# Patient Record
Sex: Male | Born: 1955
Health system: Southern US, Community
[De-identification: ages and names within clinical notes are randomized; demographics above are authoritative.]

## PROBLEM LIST (undated history)

## (undated) DIAGNOSIS — E237 Disorder of pituitary gland, unspecified: Secondary | ICD-10-CM

## (undated) DIAGNOSIS — I10 Essential (primary) hypertension: Secondary | ICD-10-CM

## (undated) HISTORY — PX: TONSILLECTOMY: SUR1361

## (undated) HISTORY — PX: DENTAL SURGERY: SHX609

---

## 1999-04-11 ENCOUNTER — Encounter: Admission: RE | Admit: 1999-04-11 | Discharge: 1999-05-29 | Payer: Self-pay | Admitting: Sports Medicine

## 1999-07-25 ENCOUNTER — Encounter: Payer: Self-pay | Admitting: Urology

## 1999-07-25 ENCOUNTER — Ambulatory Visit (HOSPITAL_COMMUNITY): Admission: RE | Admit: 1999-07-25 | Discharge: 1999-07-25 | Payer: Self-pay | Admitting: Urology

## 1999-08-20 ENCOUNTER — Ambulatory Visit (HOSPITAL_COMMUNITY): Admission: RE | Admit: 1999-08-20 | Discharge: 1999-08-20 | Payer: Self-pay | Admitting: Endocrinology

## 1999-08-20 ENCOUNTER — Encounter: Payer: Self-pay | Admitting: Endocrinology

## 1999-09-04 ENCOUNTER — Encounter: Payer: Self-pay | Admitting: Endocrinology

## 1999-09-04 ENCOUNTER — Encounter: Admission: RE | Admit: 1999-09-04 | Discharge: 1999-09-04 | Payer: Self-pay | Admitting: Endocrinology

## 2000-04-30 ENCOUNTER — Encounter: Admission: RE | Admit: 2000-04-30 | Discharge: 2000-07-02 | Payer: Self-pay | Admitting: Sports Medicine

## 2000-10-10 ENCOUNTER — Ambulatory Visit (HOSPITAL_COMMUNITY): Admission: RE | Admit: 2000-10-10 | Discharge: 2000-10-10 | Payer: Self-pay | Admitting: Endocrinology

## 2000-10-10 ENCOUNTER — Encounter: Payer: Self-pay | Admitting: Endocrinology

## 2001-06-23 ENCOUNTER — Encounter: Payer: Self-pay | Admitting: Neurology

## 2001-06-23 ENCOUNTER — Ambulatory Visit (HOSPITAL_COMMUNITY): Admission: RE | Admit: 2001-06-23 | Discharge: 2001-06-23 | Payer: Self-pay | Admitting: Neurology

## 2006-04-10 ENCOUNTER — Ambulatory Visit (HOSPITAL_COMMUNITY): Admission: RE | Admit: 2006-04-10 | Discharge: 2006-04-10 | Payer: Self-pay | Admitting: Gastroenterology

## 2013-08-23 ENCOUNTER — Emergency Department
Admission: EM | Admit: 2013-08-23 | Discharge: 2013-08-23 | Disposition: A | Discharge status: Home / Self Care | Payer: 59 | Source: Home / Self Care | Attending: Family Medicine | Admitting: Family Medicine

## 2013-08-23 ENCOUNTER — Encounter: Payer: Self-pay | Admitting: Emergency Medicine

## 2013-08-23 DIAGNOSIS — J069 Acute upper respiratory infection, unspecified: Secondary | ICD-10-CM

## 2013-08-23 DIAGNOSIS — J029 Acute pharyngitis, unspecified: Secondary | ICD-10-CM

## 2013-08-23 HISTORY — DX: Disorder of pituitary gland, unspecified: E23.7

## 2013-08-23 HISTORY — DX: Essential (primary) hypertension: I10

## 2013-08-23 LAB — POCT RAPID STREP A (OFFICE): Rapid Strep A Screen: NEGATIVE

## 2013-08-23 MED ORDER — AZITHROMYCIN 250 MG PO TABS: ORAL_TABLET | ORAL | Status: DC

## 2013-08-23 MED ORDER — BENZONATATE 200 MG PO CAPS: 200.0000 mg | ORAL_CAPSULE | Freq: Every day | ORAL | Status: DC

## 2013-08-23 NOTE — ED Notes (Signed)
Sore throat with possible low grade fever x 2 days. Taken Aleve. Rec'd flu vac this season.

## 2013-08-23 NOTE — Discharge Instructions (Signed)
Take plain Mucinex (1200 mg guaifenesin) twice daily for cough and congestion.  May add Sudafed for sinus congestion.   Increase fluid intake, rest. May use Afrin nasal spray (or generic oxymetazoline) twice daily for about 5 days.  Also recommend using saline nasal spray several times daily and saline nasal irrigation (AYR is a common brand) Try warm salt water gargles for sore throat.  Stop all antihistamines for now, and other non-prescription cough/cold preparations. May take Aleve, 2 tabs every 12 hours for sore throat, fever, etc. Begin Azithromycin if not improving about one week or if persistent fever develops  Follow-up with family doctor if not improving about10 days.    Salt Water Gargle This solution will help make your mouth and throat feel better. HOME CARE INSTRUCTIONS   Mix 1 teaspoon of salt in 8 ounces of warm water.  Gargle with this solution as much or often as you need or as directed. Swish and gargle gently if you have any sores or wounds in your mouth.  Do not swallow this mixture. Document Released: 04/11/2004 Document Revised: 09/30/2011 Document Reviewed: 09/02/2008 [facility] Patient Information 2014 [place].

## 2013-08-23 NOTE — ED Provider Notes (Signed)
CSN: [id number]     Arrival date & time 08/23/13  0912 History   First MD Initiated Contact with Patient 08/23/13 [phone]     Chief Complaint  Patient presents with  . Sore Throat      HPI Comments: Patient was on a cruise ship during the past week.  Three days ago he developed a mild sore throat, followed by sinus congestion and post-nasal drainage.  He has an occasional cough.  He has had fatigue and a low grade fever.  No myalgias.  The history is provided by the patient.    Past Medical History  Diagnosis Date  . Hypertension   . Pituitary gland disorder    Past Surgical History  Procedure Laterality Date  . Tonsillectomy    . Dental surgery     Family History  Problem Relation Age of Onset  . Congestive Heart Failure Father    History  Substance Use Topics  . Smoking status: Never Smoker   . Smokeless tobacco: Never Used  . Alcohol Use: Yes    Review of Systems + sore throat + occasional cough No pleuritic pain No wheezing + nasal congestion + post-nasal drainage No sinus pain/pressure No itchy/red eyes No earache No hemoptysis No SOB + low grade fever, + chills No nausea No vomiting No abdominal pain No diarrhea No urinary symptoms No skin rash + fatigue No myalgias No headache Used OTC meds without relief  Allergies  Review of patient's allergies indicates no known allergies.  Home Medications   Current Outpatient Rx  Name  Route  Sig  Dispense  Refill  . Ezetimibe-Simvastatin (VYTORIN PO)   Oral   Take by mouth.         . Levothyroxine Sodium (SYNTHROID PO)   Oral   Take by mouth.         . Testosterone (ANDROGEL) 20.25 MG/1.25GM (1.62%) GEL   Transdermal   Place onto the skin.         . TraZODone & Diet Manage Prod (TRAZAMINE PO)   Oral   Take by mouth.         Marland Kitchen azithromycin (ZITHROMAX Z-PAK) 250 MG tablet      Take 2 tabs today; then begin one tab once daily for 4 more days. (Rx void after 08/31/13)   6 each   0   .  benzonatate (TESSALON) 200 MG capsule   Oral   Take 1 capsule (200 mg total) by mouth at bedtime. Take as needed for cough   12 capsule   0    BP 155/90  Pulse 67  Temp(Src) 98.6 F (37 C) (Oral)  Resp 12  Ht 5\' 9"  (1.753 m)  Wt 179 lb (81.194 kg)  BMI 26.42 kg/m2  SpO2 98% Physical Exam Nursing notes and Vital Signs reviewed. Appearance:  Patient appears healthy, stated age, and in no acute distress Eyes:  Pupils are equal, round, and reactive to light and accomodation.  Extraocular movement is intact.  Conjunctivae are not inflamed  Ears:  Canals normal.  Tympanic membranes normal.  Nose:  Mildly congested turbinates.  No sinus tenderness.   Pharynx:   Minimal erythema Neck:  Supple.   Nontender shotty posterior nodes are palpated bilaterally  Lungs:  Clear to auscultation.  Breath sounds are equal.  Heart:  Regular rate and rhythm without murmurs, rubs, or gallops.  Abdomen:  Nontender without masses or hepatosplenomegaly.  Bowel sounds are present.  No CVA or flank tenderness.  Extremities:  No edema.  No calf tenderness Skin:  No rash present.   ED Course  Procedures  none    Labs Reviewed  STREP A DNA PROBE  POCT RAPID STREP A (OFFICE) negative         MDM   1. Acute pharyngitis   2. Acute upper respiratory infections of unspecified site; suspect early viral URI    Throat culture pending There is no evidence of bacterial infection today.  Treat symptomatically for now: Prescription written for Benzonatate [facility]) to take at bedtime for night-time cough.  Take plain Mucinex (1200 mg guaifenesin) twice daily for cough and congestion.  May add Sudafed for sinus congestion.   Increase fluid intake, rest. May use Afrin nasal spray (or generic oxymetazoline) twice daily for about 5 days.  Also recommend using saline nasal spray several times daily and saline nasal irrigation (AYR is a common brand) Try warm salt water gargles for sore throat.  Stop all  antihistamines for now, and other non-prescription cough/cold preparations. May take Aleve, 2 tabs every 12 hours for sore throat, fever, etc. Begin Azithromycin if not improving about one week or if persistent fever develops (Given a prescription to hold, with an expiration date)  Follow-up with family doctor if not improving about10 days.     Kandra Nicolas, MD 08/23/13 1031

## 2013-08-24 LAB — STREP A DNA PROBE: GASP: NEGATIVE

## 2013-08-25 ENCOUNTER — Telehealth: Payer: Self-pay | Admitting: *Deleted

## 2015-07-26 MED FILL — TESTOSTERONE 50 MG/5 GRAM P: 50 MG/5GM | 30 days supply | Qty: 150 | Fill #1

## 2015-08-01 MED FILL — SYNTHROID 88 MCG TABLET: 88 | 90 days supply | Qty: 105 | Fill #2

## 2015-08-23 MED FILL — TESTOSTERONE 50 MG/5 GRAM P: 50 MG/5GM | 30 days supply | Qty: 150 | Fill #2

## 2015-09-04 MED FILL — VYTORIN 10-40 MG TABLET: 10-40 | 90 days supply | Qty: 90 | Fill #1

## 2015-09-20 MED FILL — TESTOSTERONE 50 MG/5 GRAM P: 50 MG/5GM | 30 days supply | Qty: 150 | Fill #3

## 2015-09-25 MED FILL — traZODone HCL 50 MG TABS: 50 | 90 days supply | Qty: 90 | Fill #2

## 2015-09-28 DIAGNOSIS — E784 Other hyperlipidemia: Secondary | ICD-10-CM | POA: Diagnosis not present

## 2015-09-28 DIAGNOSIS — E038 Other specified hypothyroidism: Secondary | ICD-10-CM | POA: Diagnosis not present

## 2015-09-28 DIAGNOSIS — E236 Other disorders of pituitary gland: Secondary | ICD-10-CM | POA: Diagnosis not present

## 2015-09-28 DIAGNOSIS — E291 Testicular hypofunction: Secondary | ICD-10-CM | POA: Diagnosis not present

## 2015-10-02 DIAGNOSIS — E291 Testicular hypofunction: Secondary | ICD-10-CM | POA: Diagnosis not present

## 2015-10-02 DIAGNOSIS — D751 Secondary polycythemia: Secondary | ICD-10-CM | POA: Diagnosis not present

## 2015-10-03 DIAGNOSIS — E291 Testicular hypofunction: Secondary | ICD-10-CM | POA: Diagnosis not present

## 2015-10-03 DIAGNOSIS — E038 Other specified hypothyroidism: Secondary | ICD-10-CM | POA: Diagnosis not present

## 2015-10-03 DIAGNOSIS — Z6827 Body mass index (BMI) 27.0-27.9, adult: Secondary | ICD-10-CM | POA: Diagnosis not present

## 2015-10-03 DIAGNOSIS — Z1389 Encounter for screening for other disorder: Secondary | ICD-10-CM | POA: Diagnosis not present

## 2015-10-03 DIAGNOSIS — R7301 Impaired fasting glucose: Secondary | ICD-10-CM | POA: Diagnosis not present

## 2015-10-03 DIAGNOSIS — E236 Other disorders of pituitary gland: Secondary | ICD-10-CM | POA: Diagnosis not present

## 2015-10-03 DIAGNOSIS — E784 Other hyperlipidemia: Secondary | ICD-10-CM | POA: Diagnosis not present

## 2015-10-03 DIAGNOSIS — K635 Polyp of colon: Secondary | ICD-10-CM | POA: Diagnosis not present

## 2015-10-03 DIAGNOSIS — D751 Secondary polycythemia: Secondary | ICD-10-CM | POA: Diagnosis not present

## 2015-10-23 MED FILL — TESTOSTERONE 50 MG/5 GRAM P: 50 MG/5GM | 30 days supply | Qty: 150 | Fill #4

## 2015-11-06 MED FILL — SYNTHROID 88 MCG TABLET: 88 | 90 days supply | Qty: 105 | Fill #3

## 2015-11-20 MED FILL — TESTOSTERONE 50 MG/5 GRAM P: 50 MG/5GM | 30 days supply | Qty: 150 | Fill #5

## 2015-12-04 MED FILL — EZETIMIBE-SIMVAS 10-40 MG T: 10-40 | 90 days supply | Qty: 90 | Fill #2

## 2015-12-25 MED FILL — TESTOSTERONE 50 MG/5 GRAM P: 50 MG/5GM | 30 days supply | Qty: 150 | Fill #0

## 2015-12-25 MED FILL — traZODone HCL 50 MG TABS: 50 | 90 days supply | Qty: 90 | Fill #3

## 2016-02-05 MED FILL — TESTOSTERONE 50 MG/5 GRAM P: 50 MG/5GM | 30 days supply | Qty: 150 | Fill #1

## 2016-02-06 MED FILL — SYNTHROID 88 MCG TABLET: 88 | 90 days supply | Qty: 105 | Fill #4

## 2016-02-26 MED FILL — EZETIMIBE-SIMVASTATIN 10-40: 10-40 | 90 days supply | Qty: 90 | Fill #0

## 2016-02-28 MED FILL — TESTOSTERONE 50 MG/5 GRAM G: 50 MG/5GM | 30 days supply | Qty: 150 | Fill #0

## 2016-03-26 MED FILL — traZODone HCL 50 MG TABS: 50 | 90 days supply | Qty: 90 | Fill #0

## 2016-03-27 MED FILL — TESTOSTERONE 50 MG/5 GRAM G: 50 MG/5GM | 30 days supply | Qty: 150 | Fill #0

## 2016-04-16 DIAGNOSIS — H52221 Regular astigmatism, right eye: Secondary | ICD-10-CM | POA: Diagnosis not present

## 2016-04-16 DIAGNOSIS — H524 Presbyopia: Secondary | ICD-10-CM | POA: Diagnosis not present

## 2016-04-16 DIAGNOSIS — H5203 Hypermetropia, bilateral: Secondary | ICD-10-CM | POA: Diagnosis not present

## 2016-04-16 MED FILL — TESTOSTERONE 50 MG/5 GRAM P: 50 MG/5GM | 30 days supply | Qty: 150 | Fill #0

## 2016-04-29 MED FILL — SYNTHROID 88 MCG TABLET: 88 | 90 days supply | Qty: 105 | Fill #0

## 2016-05-07 DIAGNOSIS — Z1211 Encounter for screening for malignant neoplasm of colon: Secondary | ICD-10-CM | POA: Diagnosis not present

## 2016-05-09 DIAGNOSIS — E298 Other testicular dysfunction: Secondary | ICD-10-CM | POA: Diagnosis not present

## 2016-05-09 DIAGNOSIS — E236 Other disorders of pituitary gland: Secondary | ICD-10-CM | POA: Diagnosis not present

## 2016-05-09 DIAGNOSIS — E038 Other specified hypothyroidism: Secondary | ICD-10-CM | POA: Diagnosis not present

## 2016-05-09 DIAGNOSIS — E784 Other hyperlipidemia: Secondary | ICD-10-CM | POA: Diagnosis not present

## 2016-05-09 DIAGNOSIS — R7301 Impaired fasting glucose: Secondary | ICD-10-CM | POA: Diagnosis not present

## 2016-05-13 DIAGNOSIS — D751 Secondary polycythemia: Secondary | ICD-10-CM | POA: Diagnosis not present

## 2016-05-13 DIAGNOSIS — K635 Polyp of colon: Secondary | ICD-10-CM | POA: Diagnosis not present

## 2016-05-13 DIAGNOSIS — E038 Other specified hypothyroidism: Secondary | ICD-10-CM | POA: Diagnosis not present

## 2016-05-13 DIAGNOSIS — E236 Other disorders of pituitary gland: Secondary | ICD-10-CM | POA: Diagnosis not present

## 2016-05-13 DIAGNOSIS — R7301 Impaired fasting glucose: Secondary | ICD-10-CM | POA: Diagnosis not present

## 2016-05-13 DIAGNOSIS — E784 Other hyperlipidemia: Secondary | ICD-10-CM | POA: Diagnosis not present

## 2016-05-13 DIAGNOSIS — E298 Other testicular dysfunction: Secondary | ICD-10-CM | POA: Diagnosis not present

## 2016-05-13 DIAGNOSIS — Z6827 Body mass index (BMI) 27.0-27.9, adult: Secondary | ICD-10-CM | POA: Diagnosis not present

## 2016-05-13 MED FILL — SIMVASTATIN 40 MG TABLET: 40 | 90 days supply | Qty: 90 | Fill #0

## 2016-05-13 MED FILL — EZETIMIBE 10 MG TABLET: 10 | 90 days supply | Qty: 90 | Fill #0

## 2016-05-13 MED FILL — TESTOSTERONE 50 MG/5 GRAM P: 50 MG/5GM | 30 days supply | Qty: 150 | Fill #0

## 2016-05-27 MED FILL — GAVILYTE-G SOLUTION: 236 | 1 days supply | Qty: 4000 | Fill #0

## 2016-05-29 DIAGNOSIS — Z1211 Encounter for screening for malignant neoplasm of colon: Secondary | ICD-10-CM | POA: Diagnosis not present

## 2016-06-05 MED FILL — TESTOSTERONE 50 MG/5 GRAM P: 50 MG/5GM | 30 days supply | Qty: 150 | Fill #1

## 2016-06-26 MED FILL — traZODone HCL 50 MG TABS: 50 | 90 days supply | Qty: 90 | Fill #1

## 2016-07-18 MED FILL — TESTOSTERONE 50 MG/5 GRAM P: 50 MG/5GM | 30 days supply | Qty: 150 | Fill #2

## 2016-08-19 MED FILL — LEVOTHYROXINE 88 MCG TABLET: 88 | 90 days supply | Qty: 105 | Fill #0

## 2016-08-19 MED FILL — TESTOSTERONE 50 MG/5 GRAM P: 50 MG/5GM | 30 days supply | Qty: 150 | Fill #3

## 2016-09-09 MED FILL — SHINGRIX VIAL KIT: 50 | 28 days supply | Qty: 2 | Fill #0

## 2016-09-09 MED FILL — SIMVASTATIN 40 MG TABLET: 40 | 90 days supply | Qty: 90 | Fill #1

## 2016-09-09 MED FILL — EZETIMIBE 10 MG TABLET: 10 | 90 days supply | Qty: 90 | Fill #1

## 2016-09-17 MED FILL — TESTOSTERONE 50 MG/5 GRAM P: 50 MG/5GM | 30 days supply | Qty: 150 | Fill #4

## 2016-09-23 MED FILL — traZODone HCL 50 MG TABS: 50 | 90 days supply | Qty: 90 | Fill #2

## 2016-10-15 MED FILL — TESTOSTERONE 50 MG/5 GRAM P: 50 MG/5GM | 30 days supply | Qty: 150 | Fill #0

## 2016-11-14 MED FILL — LEVOTHYROXINE 88 MCG TABLET: 88 | 90 days supply | Qty: 105 | Fill #1

## 2016-11-18 MED FILL — TESTOSTERONE 50 MG/5 GRAM P: 50 MG/5GM | 30 days supply | Qty: 150 | Fill #1

## 2016-11-21 DIAGNOSIS — E784 Other hyperlipidemia: Secondary | ICD-10-CM | POA: Diagnosis not present

## 2016-11-21 DIAGNOSIS — Z125 Encounter for screening for malignant neoplasm of prostate: Secondary | ICD-10-CM | POA: Diagnosis not present

## 2016-11-21 DIAGNOSIS — E039 Hypothyroidism, unspecified: Secondary | ICD-10-CM | POA: Diagnosis not present

## 2016-11-21 DIAGNOSIS — E236 Other disorders of pituitary gland: Secondary | ICD-10-CM | POA: Diagnosis not present

## 2016-11-21 DIAGNOSIS — E291 Testicular hypofunction: Secondary | ICD-10-CM | POA: Diagnosis not present

## 2016-11-21 DIAGNOSIS — R7301 Impaired fasting glucose: Secondary | ICD-10-CM | POA: Diagnosis not present

## 2016-11-26 DIAGNOSIS — E038 Other specified hypothyroidism: Secondary | ICD-10-CM | POA: Diagnosis not present

## 2016-11-26 DIAGNOSIS — E291 Testicular hypofunction: Secondary | ICD-10-CM | POA: Diagnosis not present

## 2016-11-26 DIAGNOSIS — Z6826 Body mass index (BMI) 26.0-26.9, adult: Secondary | ICD-10-CM | POA: Diagnosis not present

## 2016-11-26 DIAGNOSIS — E236 Other disorders of pituitary gland: Secondary | ICD-10-CM | POA: Diagnosis not present

## 2016-11-26 DIAGNOSIS — R7301 Impaired fasting glucose: Secondary | ICD-10-CM | POA: Diagnosis not present

## 2016-11-26 DIAGNOSIS — E784 Other hyperlipidemia: Secondary | ICD-10-CM | POA: Diagnosis not present

## 2016-11-26 DIAGNOSIS — D751 Secondary polycythemia: Secondary | ICD-10-CM | POA: Diagnosis not present

## 2016-11-26 DIAGNOSIS — K635 Polyp of colon: Secondary | ICD-10-CM | POA: Diagnosis not present

## 2016-12-02 MED FILL — SIMVASTATIN 40 MG TABLET: 40 | 90 days supply | Qty: 90 | Fill #0

## 2016-12-02 MED FILL — EZETIMIBE 10 MG TABLET: 10 | 90 days supply | Qty: 90 | Fill #0

## 2016-12-18 MED FILL — TESTOSTERONE 50 MG/5 GRAM P: 50 MG/5GM | 30 days supply | Qty: 150 | Fill #2

## 2016-12-19 MED FILL — traZODone HCL 50 MG TABS: 50 | 90 days supply | Qty: 90 | Fill #3

## 2017-01-14 MED FILL — TESTOSTERONE 50 MG/5 GRAM P: 50 MG/5GM | 30 days supply | Qty: 150 | Fill #3

## 2017-02-24 MED FILL — TESTOSTERONE 50 MG/5 GRAM P: 50 MG/5GM | 30 days supply | Qty: 150 | Fill #4

## 2017-02-26 MED FILL — SIMVASTATIN 40 MG TABLET: 40 | 90 days supply | Qty: 90 | Fill #1

## 2017-02-26 MED FILL — LEVOTHYROXINE 88 MCG TABLET: 88 | 90 days supply | Qty: 105 | Fill #2

## 2017-02-26 MED FILL — EZETIMIBE 10 MG TABLET: 10 | 90 days supply | Qty: 90 | Fill #1

## 2017-03-25 MED FILL — traZODone HCL 50 MG TABS: 50 | 90 days supply | Qty: 90 | Fill #0

## 2017-03-25 MED FILL — TESTOSTERONE 50 MG/5 GRAM P: 50 MG/5GM | 30 days supply | Qty: 150 | Fill #0

## 2017-04-22 MED FILL — TESTOSTERONE 50 MG/5 GRAM P: 50 MG/5GM | 30 days supply | Qty: 150 | Fill #1

## 2017-04-23 DIAGNOSIS — H5203 Hypermetropia, bilateral: Secondary | ICD-10-CM | POA: Diagnosis not present

## 2017-05-19 MED FILL — TESTOSTERONE 50 MG/5 GRAM P: 50 MG/5GM | 30 days supply | Qty: 150 | Fill #2

## 2017-05-28 DIAGNOSIS — E038 Other specified hypothyroidism: Secondary | ICD-10-CM | POA: Diagnosis not present

## 2017-05-28 DIAGNOSIS — E7849 Other hyperlipidemia: Secondary | ICD-10-CM | POA: Diagnosis not present

## 2017-05-28 DIAGNOSIS — R7301 Impaired fasting glucose: Secondary | ICD-10-CM | POA: Diagnosis not present

## 2017-05-28 DIAGNOSIS — E291 Testicular hypofunction: Secondary | ICD-10-CM | POA: Diagnosis not present

## 2017-06-02 DIAGNOSIS — E7849 Other hyperlipidemia: Secondary | ICD-10-CM | POA: Diagnosis not present

## 2017-06-02 DIAGNOSIS — K635 Polyp of colon: Secondary | ICD-10-CM | POA: Diagnosis not present

## 2017-06-02 DIAGNOSIS — G4700 Insomnia, unspecified: Secondary | ICD-10-CM | POA: Diagnosis not present

## 2017-06-02 DIAGNOSIS — Z0000 Encounter for general adult medical examination without abnormal findings: Secondary | ICD-10-CM | POA: Diagnosis not present

## 2017-06-02 DIAGNOSIS — E038 Other specified hypothyroidism: Secondary | ICD-10-CM | POA: Diagnosis not present

## 2017-06-02 DIAGNOSIS — E236 Other disorders of pituitary gland: Secondary | ICD-10-CM | POA: Diagnosis not present

## 2017-06-02 DIAGNOSIS — E291 Testicular hypofunction: Secondary | ICD-10-CM | POA: Diagnosis not present

## 2017-06-02 DIAGNOSIS — R7301 Impaired fasting glucose: Secondary | ICD-10-CM | POA: Diagnosis not present

## 2017-06-02 DIAGNOSIS — D751 Secondary polycythemia: Secondary | ICD-10-CM | POA: Diagnosis not present

## 2017-06-02 MED FILL — LEVOTHYROXINE 88 MCG TABLET: 88 | 90 days supply | Qty: 105 | Fill #0

## 2017-06-02 MED FILL — EZETIMIBE 10 MG TABS: 10 | 90 days supply | Qty: 90 | Fill #0

## 2017-06-02 MED FILL — SIMVASTATIN 40 MG TABLET: 40 | 90 days supply | Qty: 90 | Fill #0

## 2017-06-16 MED FILL — TESTOSTERONE 50 MG/5 GRAM P: 50 MG/5GM | 30 days supply | Qty: 150 | Fill #3

## 2017-06-24 MED FILL — traZODone HCL 50 MG TABS: 50 | 90 days supply | Qty: 90 | Fill #1

## 2017-07-16 MED FILL — TESTOSTERONE 50 MG/5 GRAM P: 50 MG/5GM | 30 days supply | Qty: 150 | Fill #4

## 2017-08-13 MED FILL — TESTOSTERONE 50 MG/5 GRAM P: 50 MG/5GM | 30 days supply | Qty: 150 | Fill #5

## 2017-08-26 MED FILL — SIMVASTATIN 40 MG TABLET: 40 | 90 days supply | Qty: 90 | Fill #1

## 2017-08-26 MED FILL — EZETIMIBE 10 MG TABS: 10 | 90 days supply | Qty: 90 | Fill #1

## 2017-08-26 MED FILL — LEVOTHYROXINE 88 MCG TABLET: 88 | 90 days supply | Qty: 105 | Fill #1

## 2017-09-22 MED FILL — traZODone HCL 50 MG TABS: 50 | 90 days supply | Qty: 90 | Fill #2

## 2017-10-02 MED FILL — TESTOSTERONE 50 MG/5 GRAM P: 50 MG/5GM | 30 days supply | Qty: 150 | Fill #0

## 2017-10-28 MED FILL — TESTOSTERONE 50 MG/5 GRAM P: 50 MG/5GM | 30 days supply | Qty: 150 | Fill #1

## 2017-11-19 MED FILL — EZETIMIBE 10 MG TABS: 10 | 90 days supply | Qty: 90 | Fill #0

## 2017-11-19 MED FILL — SIMVASTATIN 40 MG TABLET: 40 | 90 days supply | Qty: 90 | Fill #0

## 2017-11-19 MED FILL — LEVOTHYROXINE 88 MCG TABLET: 88 | 90 days supply | Qty: 105 | Fill #2

## 2017-12-01 MED FILL — TESTOSTERONE 50 MG/5 GRAM P: 50 MG/5GM | 30 days supply | Qty: 150 | Fill #2

## 2017-12-04 DIAGNOSIS — E038 Other specified hypothyroidism: Secondary | ICD-10-CM | POA: Diagnosis not present

## 2017-12-04 DIAGNOSIS — E7849 Other hyperlipidemia: Secondary | ICD-10-CM | POA: Diagnosis not present

## 2017-12-04 DIAGNOSIS — Z0000 Encounter for general adult medical examination without abnormal findings: Secondary | ICD-10-CM | POA: Diagnosis not present

## 2017-12-04 DIAGNOSIS — E291 Testicular hypofunction: Secondary | ICD-10-CM | POA: Diagnosis not present

## 2017-12-04 DIAGNOSIS — R7301 Impaired fasting glucose: Secondary | ICD-10-CM | POA: Diagnosis not present

## 2017-12-09 DIAGNOSIS — R7301 Impaired fasting glucose: Secondary | ICD-10-CM | POA: Diagnosis not present

## 2017-12-09 DIAGNOSIS — K635 Polyp of colon: Secondary | ICD-10-CM | POA: Diagnosis not present

## 2017-12-09 DIAGNOSIS — E7849 Other hyperlipidemia: Secondary | ICD-10-CM | POA: Diagnosis not present

## 2017-12-09 DIAGNOSIS — Z6827 Body mass index (BMI) 27.0-27.9, adult: Secondary | ICD-10-CM | POA: Diagnosis not present

## 2017-12-09 DIAGNOSIS — E038 Other specified hypothyroidism: Secondary | ICD-10-CM | POA: Diagnosis not present

## 2017-12-09 DIAGNOSIS — E236 Other disorders of pituitary gland: Secondary | ICD-10-CM | POA: Diagnosis not present

## 2017-12-09 DIAGNOSIS — E291 Testicular hypofunction: Secondary | ICD-10-CM | POA: Diagnosis not present

## 2017-12-09 DIAGNOSIS — D751 Secondary polycythemia: Secondary | ICD-10-CM | POA: Diagnosis not present

## 2017-12-18 MED FILL — traZODone HCL 50 MG TABS: 50 | 90 days supply | Qty: 90 | Fill #3

## 2018-01-04 MED FILL — TESTOSTERONE 50 MG/5 GRAM P: 50 MG/5GM | 30 days supply | Qty: 150 | Fill #3

## 2018-02-09 MED FILL — TESTOSTERONE 50 MG/5 GRAM P: 50 MG/5GM | 30 days supply | Qty: 150 | Fill #4

## 2018-02-26 MED FILL — LEVOTHYROXINE 88 MCG TABLET: 88 | 84 days supply | Qty: 96 | Fill #0

## 2018-02-26 MED FILL — EZETIMIBE 10 MG TABLET: 10 | 90 days supply | Qty: 90 | Fill #1

## 2018-02-26 MED FILL — SIMVASTATIN 40 MG TABS: 40 | 90 days supply | Qty: 90 | Fill #1

## 2018-02-27 MED FILL — traZODone HCL 50 MG TABS: 50 | 90 days supply | Qty: 90 | Fill #0

## 2018-03-04 DIAGNOSIS — L239 Allergic contact dermatitis, unspecified cause: Secondary | ICD-10-CM | POA: Diagnosis not present

## 2018-03-04 DIAGNOSIS — Z6827 Body mass index (BMI) 27.0-27.9, adult: Secondary | ICD-10-CM | POA: Diagnosis not present

## 2018-03-04 MED FILL — DOXYCYCLINE HYCLATE 100 MG: 100 | 7 days supply | Qty: 14 | Fill #0

## 2018-03-05 MED FILL — METHYLPREDNISOLONE 4 MG TAB: 4 | 6 days supply | Qty: 21 | Fill #0

## 2018-03-10 MED FILL — TESTOSTERONE 50 MG/5 GRAM P: 50 MG/5GM | 30 days supply | Qty: 150 | Fill #5

## 2018-04-04 DIAGNOSIS — Z23 Encounter for immunization: Secondary | ICD-10-CM | POA: Diagnosis not present

## 2018-04-14 MED FILL — TESTOSTERONE 50 MG/5 GRAM P: 50 MG/5GM | 30 days supply | Qty: 150 | Fill #0

## 2018-04-16 DIAGNOSIS — E785 Hyperlipidemia, unspecified: Secondary | ICD-10-CM | POA: Insufficient documentation

## 2018-04-16 NOTE — Progress Notes (Signed)
Cardiology Office Note:    Date:  04/17/2018   ID:  Kirk [name], DOB December 30, 1955, MRN [id number]  PCP:  [name], MD  Cardiologist:  No primary care provider on file.   Referring MD: [name], MD   Chief Complaint  Patient presents with  . Advice Only    Primary prevention    History of Present Illness:    Kirk [name] is a 62 y.o. male with a family history of CAD who is self-referred to consider primary prevention.  Prior history of hypertension initially on medical therapy but currently therapy discontinued by Dr. Forde Dandy, because of normalization.  The patient is very active.  He has no complaints with physical activity.  Denies orthopnea, PND, lower extremity swelling, chest discomfort, orthopnea, PND, and palpitations.  He has never had a cardiac diagnosis.  He does not smoke.  He is not diabetic.  He does not drink alcohol on a regular basis.  His father had CHF and died of a myocardial infarction at age 54, the patient's current age.  He exercises regularly.  He has been proactive concerning lipid management via Dr. Forde Dandy.  Most recent LDL was 69.  He has been on statin therapy for years.   Past Medical History:  Diagnosis Date  . Hypertension   . Pituitary gland disorder [facility])     Past Surgical History:  Procedure Laterality Date  . DENTAL SURGERY    . TONSILLECTOMY      Current Medications: Current Meds  Medication Sig  . aspirin EC 81 MG tablet Take 81 mg by mouth daily.  Marland Kitchen ezetimibe (ZETIA) 10 MG tablet Take 10 mg by mouth daily.  Marland Kitchen levothyroxine (SYNTHROID, LEVOTHROID) 88 MCG tablet Take 88 mcg by mouth daily before breakfast.  . Melatonin 5 MG TABS Take 5 mg by mouth at bedtime.  . Multiple Vitamins-Minerals (CENTRUM SILVER PO) Take half (1/2) tablet by mouth every other day.  . Naproxen Sodium 220 MG CAPS Take 220 mg by mouth 2 (two) times daily as needed (pain).  . simvastatin (ZOCOR) 40 MG tablet Take 40 mg by mouth daily.  Marland Kitchen testosterone  (ANDROGEL) 50 MG/5GM (1%) GEL Apply 5 grams onto the skin daily.  . traZODone (DESYREL) 50 MG tablet Take 50 mg by mouth at bedtime.     Allergies:   Patient has no known allergies.   Social History   Socioeconomic History  . Marital status: Married    Spouse name: Not on file  . Number of children: Not on file  . Years of education: Not on file  . Highest education level: Not on file  Occupational History  . Not on file  Social Needs  . Financial resource strain: Not on file  . Food insecurity:    Worry: Not on file    Inability: Not on file  . Transportation needs:    Medical: Not on file    Non-medical: Not on file  Tobacco Use  . Smoking status: Never Smoker  . Smokeless tobacco: Never Used  Substance and Sexual Activity  . Alcohol use: Yes  . Drug use: No  . Sexual activity: Not on file  Lifestyle  . Physical activity:    Days per week: Not on file    Minutes per session: Not on file  . Stress: Not on file  Relationships  . Social connections:    Talks on phone: Not on file    Gets together: Not on file  Attends religious service: Not on file    Active member of club or organization: Not on file    Attends meetings of clubs or organizations: Not on file    Relationship status: Not on file  Other Topics Concern  . Not on file  Social History Narrative  . Not on file     Family History: The patient's family history includes Congestive Heart Failure in his father; Heart disease in his father; Parkinson's disease in his mother; Stroke in his mother.  ROS:   Please see the history of present illness.    Occasional skipped heartbeat and irregularity in heartbeat.  Rare snoring according to the patient's wife.  All other systems reviewed and are negative.  EKGs/Labs/Other Studies Reviewed:    The following studies were reviewed today:  LDL 69 on Dec 04, 2017  Hemoglobin A1c 5.4 on same date  Creatinine 1.0  EKG:  EKG is  ordered today.  The ekg  ordered today demonstrates sinus rhythm, right bundle, left anterior hemiblock, PR interval is normal at 162 ms.  Recent Labs: No results found for requested labs within last 8760 hours.  Recent Lipid Panel No results found for: CHOL, TRIG, HDL, CHOLHDL, VLDL, LDLCALC, LDLDIRECT  Physical Exam:    VS:  BP (!) 136/94   Pulse 63   Ht 5' 9.5" (1.765 m)   Wt 182 lb 6.4 oz (82.7 kg)   BMI 26.55 kg/m     Wt Readings from Last 3 Encounters:  04/17/18 182 lb 6.4 oz (82.7 kg)  08/23/13 179 lb (81.2 kg)     GEN:  Well nourished, well developed in no acute distress HEENT: Normal NECK: No JVD. LYMPHATICS: No lymphadenopathy CARDIAC: RRR, no murmur, S4 gallop, no edema. VASCULAR: 2+ bilateral radial pulses.  No bruits. RESPIRATORY:  Clear to auscultation without rales, wheezing or rhonchi  ABDOMEN: Soft, non-tender, non-distended, No pulsatile mass, MUSCULOSKELETAL: No deformity  SKIN: Warm and dry NEUROLOGIC:  Alert and oriented x 3 PSYCHIATRIC:  Normal affect   ASSESSMENT:    1. Abnormal EKG   2. Other hyperlipidemia   3. Essential hypertension    PLAN:    In order of problems listed above:  1. 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  2D Doppler echocardiogram to rule out myocardial process and to assess LV function. 2. Already on statin therapy and agree with management 3. Measure blood pressures at home.  Exclude the possibility that the ones treated hypertension has recurred and needs therapy.  Low-salt diet discussed.  We will perform an exercise treadmill test to assess blood pressure with physical activity since he is so active.  If LVH is present on echocardiography, this will suggest that blood pressure therapy is again needed.  Target should be 130/80 mmHg or less.  Further follow-up and management will be dependent upon findings.   Medication Adjustments/Labs and Tests Ordered: Current medicines are reviewed at  length with the patient today.  Concerns regarding medicines are outlined above.  Orders Placed This Encounter  Procedures  . Exercise Tolerance Test  . EKG 12-Lead  . ECHOCARDIOGRAM COMPLETE   No orders of the defined types were placed in this encounter.   Patient Instructions  Medication Instructions:  Your physician recommends that you continue on your current medications as directed. Please refer to the Current Medication list given to you today.  Labwork: None  Testing/Procedures: Your physician has requested that you have an exercise tolerance test. For further  information please visit [email or web address]. Please also follow instruction sheet, as given.  Your physician has requested that you have an echocardiogram. Echocardiography is a painless test that uses sound waves to create images of your heart. It provides your doctor with information about the size and shape of your heart and how well your heart's chambers and valves are working. This procedure takes approximately one hour. There are no restrictions for this procedure.   Follow-Up: Your physician recommends that you schedule a follow-up appointment as needed with Dr. Tamala Julian.    Any Other Special Instructions Will Be Listed Below (If Applicable).     If you need a refill on your cardiac medications before your next appointment, please call your pharmacy.      Signed, Sinclair Grooms, MD  04/17/2018 5:19 PM    [place]

## 2018-04-17 ENCOUNTER — Encounter: Payer: Self-pay | Admitting: Interventional Cardiology

## 2018-04-17 ENCOUNTER — Encounter (INDEPENDENT_AMBULATORY_CARE_PROVIDER_SITE_OTHER): Payer: Self-pay

## 2018-04-17 ENCOUNTER — Ambulatory Visit (INDEPENDENT_AMBULATORY_CARE_PROVIDER_SITE_OTHER): Payer: 59 | Admitting: Interventional Cardiology

## 2018-04-17 VITALS — BP 136/94 | HR 63 | Ht 69.5 in | Wt 182.4 lb

## 2018-04-17 DIAGNOSIS — R9431 Abnormal electrocardiogram [ECG] [EKG]: Secondary | ICD-10-CM | POA: Diagnosis not present

## 2018-04-17 DIAGNOSIS — I10 Essential (primary) hypertension: Secondary | ICD-10-CM

## 2018-04-17 DIAGNOSIS — E7849 Other hyperlipidemia: Secondary | ICD-10-CM | POA: Diagnosis not present

## 2018-04-17 NOTE — Patient Instructions (Signed)
Medication Instructions:  Your physician recommends that you continue on your current medications as directed. Please refer to the Current Medication list given to you today.  Labwork: None  Testing/Procedures: Your physician has requested that you have an exercise tolerance test. For further information please visit [email or web address]. Please also follow instruction sheet, as given.  Your physician has requested that you have an echocardiogram. Echocardiography is a painless test that uses sound waves to create images of your heart. It provides your doctor with information about the size and shape of your heart and how well your heart's chambers and valves are working. This procedure takes approximately one hour. There are no restrictions for this procedure.   Follow-Up: Your physician recommends that you schedule a follow-up appointment as needed with Dr. Smith.    Any Other Special Instructions Will Be Listed Below (If Applicable).     If you need a refill on your cardiac medications before your next appointment, please call your pharmacy.   

## 2018-04-23 ENCOUNTER — Ambulatory Visit (INDEPENDENT_AMBULATORY_CARE_PROVIDER_SITE_OTHER): Payer: 59

## 2018-04-23 ENCOUNTER — Other Ambulatory Visit: Payer: Self-pay

## 2018-04-23 ENCOUNTER — Ambulatory Visit (HOSPITAL_COMMUNITY): Payer: 59 | Attending: Cardiovascular Disease

## 2018-04-23 DIAGNOSIS — Z8249 Family history of ischemic heart disease and other diseases of the circulatory system: Secondary | ICD-10-CM | POA: Insufficient documentation

## 2018-04-23 DIAGNOSIS — R9431 Abnormal electrocardiogram [ECG] [EKG]: Secondary | ICD-10-CM | POA: Diagnosis not present

## 2018-04-23 DIAGNOSIS — I10 Essential (primary) hypertension: Secondary | ICD-10-CM

## 2018-04-23 DIAGNOSIS — E7849 Other hyperlipidemia: Secondary | ICD-10-CM

## 2018-04-23 LAB — EXERCISE TOLERANCE TEST
CSEPEDS: 9 s
CSEPPHR: 141 {beats}/min
Estimated workload: 11.9 METS
Exercise duration (min): 10 min
MPHR: 158 {beats}/min
Percent HR: 89 %
RPE: 19
Rest HR: 56 {beats}/min

## 2018-04-28 ENCOUNTER — Telehealth: Payer: Self-pay | Admitting: Interventional Cardiology

## 2018-04-28 NOTE — Telephone Encounter (Signed)
Will route to Dr. Tamala Julian to review echo, GXT and below listed BP's.

## 2018-04-28 NOTE — Telephone Encounter (Signed)
New Message:    Patient is calling for ECHO Results.     BP Results:    160/90   162/96   140/90   138/88

## 2018-04-29 NOTE — Telephone Encounter (Signed)
See echo and stress test results instructions.

## 2018-04-30 MED ORDER — AMLODIPINE BESYLATE 5 MG PO TABS: 5.0000 mg | ORAL_TABLET | Freq: Every day | ORAL | 3 refills | Status: AC

## 2018-04-30 MED FILL — AMLODIPINE BESYLATE 5 MG TA: 5 | 90 days supply | Qty: 90 | Fill #0

## 2018-04-30 NOTE — Telephone Encounter (Signed)
Spoke with pt and went over information from Dr. Tamala Julian.  Pt agreeable to try Amlodipine.  Pt would like to come in and discuss results with Dr. Tamala Julian.  Scheduled him for next available appt.  Advised pt to monitor BP and call if BP too low.  Pt verbalized understanding and was in agreement with this plan.

## 2018-05-08 MED FILL — TESTOSTERONE 50 MG/5 GRAM P: 50 MG/5GM | 30 days supply | Qty: 150 | Fill #1

## 2018-05-11 NOTE — Progress Notes (Signed)
Cardiology Office Note:    Date:  05/12/2018   ID:  Kirk [name], DOB [birth year], MRN [id number]  PCP:  Kirk Bowen, MD  Cardiologist:  Kirk Grooms, MD   Referring MD: Kirk Bowen, MD   Chief Complaint  Patient presents with  . Advice Only    Discussed recent cardiac testing  . Abnormal ECG    Bifascicular block    History of Present Illness:    Kirk [name] is a 62 y.o. male with a hx of bifascicular block, essential hypertension (previously treated but not on therapy when initially seen for consultation), and avid hiker many times walking greater than 8 miles [place] the [place] carrying a 60 pound backpack.   Kirk [name] returns today with his wife.  He has a long list of questions concerning his health and also wants to discuss in detail the results of recent cardiac testing.  Cardiac testing was performed after we noted bifascicular block on EKG.  He was relatively asymptomatic.  We did note on exercise testing that he had hypertensive blood pressure response.  Furthermore there is a long-standing history of hypertension, which eventually was managed on diet and exercise without pharmacologic agents.  He has had no chest discomfort and denies dyspnea.  He has never had syncope.    Past Medical History:  Diagnosis Date  . Hypertension   . 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 DENTAL SURGERY    . TONSILLECTOMY      Current Medications: Current Meds  Medication Sig  . amLODipine (NORVASC) 5 MG tablet Take 1 tablet (5 mg total) by mouth daily.  Marland Kitchen aspirin EC 81 MG tablet Take 81 mg by mouth daily.  Marland Kitchen ezetimibe (ZETIA) 10 MG tablet Take 10 mg by mouth daily.  Marland Kitchen levothyroxine (SYNTHROID, LEVOTHROID) 88 MCG tablet Take 88 mcg by mouth daily before breakfast.  . Melatonin 5 MG TABS Take 5 mg by mouth at bedtime.  . Multiple Vitamins-Minerals (CENTRUM SILVER PO) Take half (1/2) tablet by mouth every other day.  .  Naproxen Sodium 220 MG CAPS Take 220 mg by mouth 2 (two) times daily as needed (pain).  . simvastatin (ZOCOR) 40 MG tablet Take 40 mg by mouth daily.  Marland Kitchen testosterone (ANDROGEL) 50 MG/5GM (1%) GEL Apply 5 grams onto the skin daily.  . traZODone (DESYREL) 50 MG tablet Take 50 mg by mouth at bedtime.     Allergies:   Patient has no known allergies.   Social History   Socioeconomic History  . Marital status: Married    Spouse name: Not on file  . Number of children: Not on file  . Years of education: Not on file  . Highest education level: Not on file  Occupational History  . Not on file  Social Needs  . Financial resource strain: Not on file  . Food insecurity:    Worry: Not on file    Inability: Not on file  . Transportation needs:    Medical: Not on file    Non-medical: Not on file  Tobacco Use  . Smoking status: Never Smoker  . Smokeless tobacco: Never Used  Substance and Sexual Activity  . Alcohol use: Yes  . Drug use: No  . Sexual activity: Not on file  Lifestyle  . Physical activity:    Days per week: Not on file    Minutes per session: Not on file  . Stress: Not  on file  Relationships  . Social connections:    Talks on phone: Not on file    Gets together: Not on file    Attends religious service: Not on file    Active member of club or organization: Not on file    Attends meetings of clubs or organizations: Not on file    Relationship status: Not on file  Other Topics Concern  . Not on file  Social History Narrative  . Not on file     Family History: The patient's family history includes Congestive Heart Failure in his father; Heart disease in his father; Parkinson's disease in his mother; Stroke in his mother.  ROS:   Please see the history of present illness.    Anxiety concerning the finding of an abnormal EKG when he had assumed he was in excellent care and that everything was totally normal.  All other systems reviewed and are  negative.  EKGs/Labs/Other Studies Reviewed:    The following studies were reviewed today : Exercise treadmill test 04/23/2018:  Study Highlights    There was no ST segment deviation noted during stress.  The patient walked for 10:09 of a Bruce protocol GXT. He had a RBBB with associated ST / T wave abn at baseline  Peak HR of 141 which is 89% predictected maximal HR . BP response to exercise was hypertensive.  The baseline ST /T abnormalities worsened with exercise.  There were no no ST abnormalities to suggest ischemia  Good exercise capacity. no arrhythmias at peak exercise  Nondiagnostic GXT due to baseline ST /T wave abnormalities.   2D Doppler echocardiogram 04/23/2018:  Study Conclusions   - Left ventricle: The cavity size was normal. Systolic function was   normal. The estimated ejection fraction was in the range of 60%   to 65%. Wall motion was normal; there were no regional wall   motion abnormalities. Doppler parameters are consistent with   abnormal left ventricular relaxation (grade 1 diastolic   dysfunction). - Aortic valve: Transvalvular velocity was within the normal range.   There was no stenosis. There was no regurgitation. - Aorta: Ascending aortic diameter: 38 mm (S). - Ascending aorta: The ascending aorta was mildly dilated. - Mitral valve: Transvalvular velocity was within the normal range.   There was no evidence for stenosis. There was no regurgitation. - Right ventricle: The cavity size was mildly dilated. Wall   thickness was normal. Systolic function was normal. - Atrial septum: No defect or patent foramen ovale was identified   by color flow Doppler. - Tricuspid valve: There was no regurgitation.     EKG:  EKG is not ordered today.  The ekg   Recent Labs: No results found for requested labs within last 8760 hours.  Recent Lipid Panel No results found for: CHOL, TRIG, HDL, CHOLHDL, VLDL, LDLCALC, LDLDIRECT  Physical Exam:    VS:  BP  138/88   Pulse (!) 59   Ht 5\' 9"  (1.753 m)   Wt 178 lb (80.7 kg)   BMI 26.29 kg/m     Wt Readings from Last 3 Encounters:  05/12/18 178 lb (80.7 kg)  04/17/18 182 lb 6.4 oz (82.7 kg)  08/23/13 179 lb (81.2 kg)     GEN:  Well nourished, well developed in no acute distress HEENT: Normal NECK: No JVD. LYMPHATICS: No lymphadenopathy CARDIAC: RRR, no murmur, no gallop, no  edema. VASCULAR: 2+ bilateral radial and carotid pulses. no bruits. RESPIRATORY:  Clear to auscultation without rales,  wheezing or rhonchi  ABDOMEN: Soft, non-tender, non-distended, No pulsatile mass, MUSCULOSKELETAL: No deformity  SKIN: Warm and dry NEUROLOGIC:  Alert and oriented x 3 PSYCHIATRIC:  Normal affect   ASSESSMENT:    1. Right bundle branch block (RBBB) with left anterior hemiblock   2. Other hyperlipidemia   3. Essential hypertension   4. Abnormal EKG    PLAN:    In order of problems listed above:  1. Unchanged. 2. Lipids are excellent on Zetia. 3. Long discussion concerning blood pressure, targets, with goal blood pressure identified as 130/80 mmHg or less.  Low-salt diet discussed in detail.  Counseling and advice concerning level of activity, blood pressure target, lipid targets, natural history of conduction abnormality, and clinical follow-up were discussed in detail.  He was instructed on proper timing of blood pressure monitoring in relationship to medication intake.  He was asked to avoid taking his blood pressure before antihypertensives have been taken or within the first hour after therapy is on board.  2 to 12 hours after medication is a sweet spot for blood pressure recording and these pressures can be more easily used for medication titration without concern for overmedicating.  Greater than 50% of the time during this office visit was spent in education, counseling, and coordination of care related to underlying disease process and testing as outlined.  Medication Adjustments/Labs  and Tests Ordered: Current medicines are reviewed at length with the patient today.  Concerns regarding medicines are outlined above.  No orders of the defined types were placed in this encounter.  No orders of the defined types were placed in this encounter.   Patient Instructions  Medication Instructions:  Your physician recommends that you continue on your current medications as directed. Please refer to the Current Medication list given to you today.  If you need a refill on your cardiac medications before your next appointment, please call your pharmacy.   Lab work: None If you have labs (blood work) drawn today and your tests are completely normal, you will receive your results only by: Marland Kitchen MyChart Message (if you have MyChart) OR . A paper copy in the mail If you have any lab test that is abnormal or we need to change your treatment, we will call you to review the results.  Testing/Procedures: None  Follow-Up: At [facility], you and your health needs are our priority.  As part of our continuing mission to provide you with exceptional heart care, we have created designated Provider Care Teams.  These Care Teams include your primary Cardiologist (physician) and Advanced Practice Providers (APPs -  Physician Assistants and Nurse Practitioners) who all work together to provide you with the care you need, when you need it. You will need a follow up appointment in 12 months.  Please call our office 2 months in advance to schedule this appointment.  You may see Dr. Tamala Julian or one of the following Advanced Practice Providers on your designated Care Team:   Truitt Merle, NP Cecilie Kicks, NP . Kathyrn Drown, NP  Any Other Special Instructions Will Be Listed Below (If Applicable).  Please check your blood pressure 2-12 hours after your Amlodipine and contact us with those readings after 1-2 weeks.      Signed, Kirk Grooms, MD  05/12/2018 12:45 PM    [place]

## 2018-05-12 ENCOUNTER — Ambulatory Visit (INDEPENDENT_AMBULATORY_CARE_PROVIDER_SITE_OTHER): Payer: 59 | Admitting: Interventional Cardiology

## 2018-05-12 ENCOUNTER — Encounter: Payer: Self-pay | Admitting: Interventional Cardiology

## 2018-05-12 VITALS — BP 138/88 | HR 59 | Ht 69.0 in | Wt 178.0 lb

## 2018-05-12 DIAGNOSIS — E7849 Other hyperlipidemia: Secondary | ICD-10-CM | POA: Diagnosis not present

## 2018-05-12 DIAGNOSIS — I452 Bifascicular block: Secondary | ICD-10-CM | POA: Diagnosis not present

## 2018-05-12 DIAGNOSIS — I10 Essential (primary) hypertension: Secondary | ICD-10-CM | POA: Diagnosis not present

## 2018-05-12 DIAGNOSIS — R9431 Abnormal electrocardiogram [ECG] [EKG]: Secondary | ICD-10-CM | POA: Diagnosis not present

## 2018-05-12 NOTE — Patient Instructions (Signed)
Medication Instructions:  Your physician recommends that you continue on your current medications as directed. Please refer to the Current Medication list given to you today.  If you need a refill on your cardiac medications before your next appointment, please call your pharmacy.   Lab work: None If you have labs (blood work) drawn today and your tests are completely normal, you will receive your results only by: Marland Kitchen MyChart Message (if you have MyChart) OR . A paper copy in the mail If you have any lab test that is abnormal or we need to change your treatment, we will call you to review the results.  Testing/Procedures: None  Follow-Up: At [facility], you and your health needs are our priority.  As part of our continuing mission to provide you with exceptional heart care, we have created designated Provider Care Teams.  These Care Teams include your primary Cardiologist (physician) and Advanced Practice Providers (APPs -  Physician Assistants and Nurse Practitioners) who all work together to provide you with the care you need, when you need it. You will need a follow up appointment in 12 months.  Please call our office 2 months in advance to schedule this appointment.  You may see Dr. Tamala Julian or one of the following Advanced Practice Providers on your designated Care Team:   Truitt Merle, NP Cecilie Kicks, NP . Kathyrn Drown, NP  Any Other Special Instructions Will Be Listed Below (If Applicable).  Please check your blood pressure 2-12 hours after your Amlodipine and contact us with those readings after 1-2 weeks.

## 2018-05-13 ENCOUNTER — Ambulatory Visit: Payer: 59 | Admitting: Interventional Cardiology

## 2018-05-27 NOTE — Telephone Encounter (Signed)
Start losartan HCT 50/12.5 mg/day. Continue to monitor the blood pressure daily at least 2 hours after blood pressure medicines have been taken each morning. Blood pressures lower than 100/60 mmHg or too low.  Trying to achieve a target blood pressure of 130/80 mmHg.

## 2018-05-29 ENCOUNTER — Other Ambulatory Visit: Payer: Self-pay | Admitting: *Deleted

## 2018-05-29 MED ORDER — LOSARTAN POTASSIUM-HCTZ 50-12.5 MG PO TABS: 1.0000 | ORAL_TABLET | Freq: Every day | ORAL | 3 refills | Status: DC

## 2018-05-29 MED FILL — LOSARTAN-HCTZ 50-12.5 MG TA: 50-12.5 | 30 days supply | Qty: 30 | Fill #0

## 2018-06-01 MED FILL — LEVOTHYROXINE 88 MCG TABLET: 88 | 84 days supply | Qty: 96 | Fill #1

## 2018-06-01 MED FILL — EZETIMIBE 10 MG TABLET: 10 | 90 days supply | Qty: 90 | Fill #0

## 2018-06-01 MED FILL — SIMVASTATIN 40 MG TABS: 40 | 90 days supply | Qty: 90 | Fill #0

## 2018-06-10 DIAGNOSIS — E7849 Other hyperlipidemia: Secondary | ICD-10-CM | POA: Diagnosis not present

## 2018-06-10 DIAGNOSIS — R7301 Impaired fasting glucose: Secondary | ICD-10-CM | POA: Diagnosis not present

## 2018-06-10 DIAGNOSIS — E038 Other specified hypothyroidism: Secondary | ICD-10-CM | POA: Diagnosis not present

## 2018-06-10 DIAGNOSIS — E291 Testicular hypofunction: Secondary | ICD-10-CM | POA: Diagnosis not present

## 2018-06-11 MED FILL — TESTOSTERONE 50 MG/5 GRAM P: 50 MG/5GM | 30 days supply | Qty: 150 | Fill #2

## 2018-06-16 ENCOUNTER — Ambulatory Visit: Payer: 59 | Admitting: Interventional Cardiology

## 2018-06-16 DIAGNOSIS — E038 Other specified hypothyroidism: Secondary | ICD-10-CM | POA: Diagnosis not present

## 2018-06-16 DIAGNOSIS — K635 Polyp of colon: Secondary | ICD-10-CM | POA: Diagnosis not present

## 2018-06-16 DIAGNOSIS — D751 Secondary polycythemia: Secondary | ICD-10-CM | POA: Diagnosis not present

## 2018-06-16 DIAGNOSIS — R7301 Impaired fasting glucose: Secondary | ICD-10-CM | POA: Diagnosis not present

## 2018-06-16 DIAGNOSIS — E291 Testicular hypofunction: Secondary | ICD-10-CM | POA: Diagnosis not present

## 2018-06-16 DIAGNOSIS — E7849 Other hyperlipidemia: Secondary | ICD-10-CM | POA: Diagnosis not present

## 2018-06-16 DIAGNOSIS — I5189 Other ill-defined heart diseases: Secondary | ICD-10-CM | POA: Diagnosis not present

## 2018-06-16 DIAGNOSIS — E236 Other disorders of pituitary gland: Secondary | ICD-10-CM | POA: Diagnosis not present

## 2018-06-16 DIAGNOSIS — I452 Bifascicular block: Secondary | ICD-10-CM | POA: Diagnosis not present

## 2018-06-22 MED FILL — traZODone HCL 50 MG TABS: 50 | 90 days supply | Qty: 90 | Fill #1

## 2018-06-22 MED FILL — LOSARTAN-HCTZ 100-25 MG TAB: 100-25 | 30 days supply | Qty: 15 | Fill #0

## 2018-07-08 MED FILL — TESTOSTERONE 50 MG/5 GRAM P: 50 MG/5GM | 30 days supply | Qty: 150 | Fill #3

## 2018-07-27 MED FILL — LOSARTAN-HCTZ 100-25 MG TAB: 100-25 | 30 days supply | Qty: 15 | Fill #1

## 2018-08-24 MED FILL — SIMVASTATIN 40 MG TABLET: 40 | 90 days supply | Qty: 90 | Fill #1

## 2018-08-24 MED FILL — LOSARTAN-HCTZ 100-25 MG TAB: 100-25 | 30 days supply | Qty: 15 | Fill #2

## 2018-08-24 MED FILL — EZETIMIBE 10 MG TABS: 10 | 90 days supply | Qty: 90 | Fill #1

## 2018-08-24 MED FILL — TESTOSTERONE 50 MG/5 GRAM P: 50 MG/5GM | 30 days supply | Qty: 150 | Fill #4

## 2018-08-24 MED FILL — LEVOTHYROXINE 88 MCG TABLET: 88 | 84 days supply | Qty: 96 | Fill #0

## 2018-09-02 DIAGNOSIS — H2513 Age-related nuclear cataract, bilateral: Secondary | ICD-10-CM | POA: Diagnosis not present

## 2018-09-02 DIAGNOSIS — H33303 Unspecified retinal break, bilateral: Secondary | ICD-10-CM | POA: Diagnosis not present

## 2018-09-02 DIAGNOSIS — H3533 Angioid streaks of macula: Secondary | ICD-10-CM | POA: Diagnosis not present

## 2018-09-02 DIAGNOSIS — H524 Presbyopia: Secondary | ICD-10-CM | POA: Diagnosis not present

## 2018-09-02 DIAGNOSIS — H52223 Regular astigmatism, bilateral: Secondary | ICD-10-CM | POA: Diagnosis not present

## 2018-09-02 DIAGNOSIS — H5203 Hypermetropia, bilateral: Secondary | ICD-10-CM | POA: Diagnosis not present

## 2018-09-14 MED FILL — traZODone HCL 50 MG TABS: 50 | 90 days supply | Qty: 90 | Fill #0

## 2018-09-21 MED FILL — TESTOSTERONE 50 MG/5 GRAM P: 50 MG/5GM | 30 days supply | Qty: 150 | Fill #5

## 2018-09-21 MED FILL — LOSARTAN-HCTZ 100-25 MG TAB: 100-25 | 30 days supply | Qty: 15 | Fill #3

## 2018-10-12 MED FILL — LOSARTAN-HCTZ 100-25 MG TAB: 100-25 | 30 days supply | Qty: 15 | Fill #4

## 2018-10-16 MED FILL — traZODone HCL 50 MG TABS: 50 | 90 days supply | Qty: 90 | Fill #1

## 2018-10-16 MED FILL — LEVOTHYROXINE 88 MCG TABLET: 88 | 84 days supply | Qty: 96 | Fill #1

## 2018-10-19 MED FILL — TESTOSTERONE 50 MG/5 GRAM P: 50 MG/5GM | 30 days supply | Qty: 150 | Fill #0

## 2018-11-05 MED FILL — SIMVASTATIN 40 MG TABLET: 40 | 90 days supply | Qty: 90 | Fill #0

## 2018-11-05 MED FILL — EZETIMIBE 10 MG TABS: 10 | 90 days supply | Qty: 90 | Fill #0

## 2018-11-05 MED FILL — LOSARTAN-HCTZ 100-25 MG TAB: 100-25 | 90 days supply | Qty: 45 | Fill #5

## 2018-11-12 MED FILL — TESTOSTERONE 50 MG/5 GRAM P: 50 MG/5GM | 30 days supply | Qty: 150 | Fill #0

## 2018-12-15 DIAGNOSIS — E291 Testicular hypofunction: Secondary | ICD-10-CM | POA: Diagnosis not present

## 2018-12-15 DIAGNOSIS — R7301 Impaired fasting glucose: Secondary | ICD-10-CM | POA: Diagnosis not present

## 2018-12-15 DIAGNOSIS — E7849 Other hyperlipidemia: Secondary | ICD-10-CM | POA: Diagnosis not present

## 2018-12-15 DIAGNOSIS — E038 Other specified hypothyroidism: Secondary | ICD-10-CM | POA: Diagnosis not present

## 2018-12-15 DIAGNOSIS — E559 Vitamin D deficiency, unspecified: Secondary | ICD-10-CM | POA: Diagnosis not present

## 2018-12-18 DIAGNOSIS — E236 Other disorders of pituitary gland: Secondary | ICD-10-CM | POA: Diagnosis not present

## 2018-12-18 DIAGNOSIS — E039 Hypothyroidism, unspecified: Secondary | ICD-10-CM | POA: Diagnosis not present

## 2018-12-18 DIAGNOSIS — E785 Hyperlipidemia, unspecified: Secondary | ICD-10-CM | POA: Diagnosis not present

## 2018-12-18 DIAGNOSIS — I5189 Other ill-defined heart diseases: Secondary | ICD-10-CM | POA: Diagnosis not present

## 2018-12-18 DIAGNOSIS — E291 Testicular hypofunction: Secondary | ICD-10-CM | POA: Diagnosis not present

## 2018-12-18 DIAGNOSIS — K635 Polyp of colon: Secondary | ICD-10-CM | POA: Diagnosis not present

## 2018-12-18 DIAGNOSIS — I452 Bifascicular block: Secondary | ICD-10-CM | POA: Diagnosis not present

## 2018-12-18 DIAGNOSIS — D751 Secondary polycythemia: Secondary | ICD-10-CM | POA: Diagnosis not present

## 2018-12-18 DIAGNOSIS — R7301 Impaired fasting glucose: Secondary | ICD-10-CM | POA: Diagnosis not present

## 2018-12-30 MED FILL — LEVOTHYROXINE 88 MCG TABLET: 88 | 84 days supply | Qty: 96 | Fill #0

## 2018-12-30 MED FILL — TESTOSTERONE 50 MG/5 GRAM P: 50 MG/5GM | 30 days supply | Qty: 150 | Fill #1

## 2019-01-26 MED FILL — LOSARTAN-HCTZ 100-25 MG TAB: 100-25 | 30 days supply | Qty: 15 | Fill #6

## 2019-01-26 MED FILL — SIMVASTATIN 40 MG TABLET: 40 | 90 days supply | Qty: 90 | Fill #1

## 2019-01-26 MED FILL — EZETIMIBE 10 MG TABS: 10 | 90 days supply | Qty: 90 | Fill #1

## 2019-01-26 MED FILL — TESTOSTERONE 50 MG/5 GRAM P: 50 MG/5GM | 30 days supply | Qty: 150 | Fill #2

## 2019-01-28 MED FILL — CLINDAMYCIN HCL 150 MG CAPS: 150 | 7 days supply | Qty: 28 | Fill #0

## 2019-01-28 MED FILL — IBUPROFEN 600 MG TABLET: 600 | 4 days supply | Qty: 16 | Fill #0

## 2019-01-28 MED FILL — traZODone HCL 50 MG TABS: 50 | 90 days supply | Qty: 90 | Fill #0

## 2019-02-26 MED FILL — LOSARTAN-HCTZ 100-25 MG TAB: 100-25 | 30 days supply | Qty: 15 | Fill #7

## 2019-03-02 MED FILL — TESTOSTERONE 50 MG/5 GRAM P: 50 MG/5GM | 30 days supply | Qty: 150 | Fill #3

## 2019-03-24 MED FILL — LEVOTHYROXINE 88 MCG TABLET: 88 | 84 days supply | Qty: 96 | Fill #1

## 2019-03-26 MED FILL — TESTOSTERONE 50 MG/5 GRAM P: 50 MG/5GM | 30 days supply | Qty: 150 | Fill #4

## 2019-03-26 MED FILL — LOSARTAN-HCTZ 100-25 MG TAB: 100-25 | 30 days supply | Qty: 15 | Fill #8

## 2019-04-26 ENCOUNTER — Other Ambulatory Visit: Payer: Self-pay | Admitting: Interventional Cardiology

## 2019-04-27 ENCOUNTER — Other Ambulatory Visit: Payer: Self-pay | Admitting: Interventional Cardiology

## 2019-04-28 MED FILL — LOSARTAN-HCTZ 100-25 MG TAB: 100-25 | 30 days supply | Qty: 15 | Fill #0

## 2019-05-06 DIAGNOSIS — E559 Vitamin D deficiency, unspecified: Secondary | ICD-10-CM | POA: Diagnosis not present

## 2019-05-06 DIAGNOSIS — E785 Hyperlipidemia, unspecified: Secondary | ICD-10-CM | POA: Diagnosis not present

## 2019-05-06 DIAGNOSIS — E038 Other specified hypothyroidism: Secondary | ICD-10-CM | POA: Diagnosis not present

## 2019-05-06 DIAGNOSIS — Z23 Encounter for immunization: Secondary | ICD-10-CM | POA: Diagnosis not present

## 2019-05-06 DIAGNOSIS — E291 Testicular hypofunction: Secondary | ICD-10-CM | POA: Diagnosis not present

## 2019-05-06 DIAGNOSIS — R7301 Impaired fasting glucose: Secondary | ICD-10-CM | POA: Diagnosis not present

## 2019-05-10 DIAGNOSIS — I5189 Other ill-defined heart diseases: Secondary | ICD-10-CM | POA: Diagnosis not present

## 2019-05-10 DIAGNOSIS — E039 Hypothyroidism, unspecified: Secondary | ICD-10-CM | POA: Diagnosis not present

## 2019-05-10 DIAGNOSIS — Z1331 Encounter for screening for depression: Secondary | ICD-10-CM | POA: Diagnosis not present

## 2019-05-10 DIAGNOSIS — E236 Other disorders of pituitary gland: Secondary | ICD-10-CM | POA: Diagnosis not present

## 2019-05-10 DIAGNOSIS — R7301 Impaired fasting glucose: Secondary | ICD-10-CM | POA: Diagnosis not present

## 2019-05-10 DIAGNOSIS — I7300 Raynaud's syndrome without gangrene: Secondary | ICD-10-CM | POA: Diagnosis not present

## 2019-05-10 DIAGNOSIS — E785 Hyperlipidemia, unspecified: Secondary | ICD-10-CM | POA: Diagnosis not present

## 2019-05-10 DIAGNOSIS — E291 Testicular hypofunction: Secondary | ICD-10-CM | POA: Diagnosis not present

## 2019-05-10 DIAGNOSIS — D751 Secondary polycythemia: Secondary | ICD-10-CM | POA: Diagnosis not present

## 2019-05-10 DIAGNOSIS — E559 Vitamin D deficiency, unspecified: Secondary | ICD-10-CM | POA: Diagnosis not present

## 2019-06-01 MED FILL — LOSARTAN-HCTZ 100-25 MG TAB: 100-25 | 30 days supply | Qty: 15 | Fill #1

## 2019-06-22 MED FILL — TESTOSTERONE 50 MG/5 GRAM P: 50 MG/5GM | 30 days supply | Qty: 150 | Fill #0

## 2019-06-22 MED FILL — EZETIMIBE 10 MG TABS: 10 | 90 days supply | Qty: 90 | Fill #0

## 2019-06-22 MED FILL — SIMVASTATIN 40 MG TABLET: 40 | 90 days supply | Qty: 90 | Fill #0

## 2019-06-24 MED FILL — LOSARTAN-HCTZ 100-25 MG TAB: 100-25 | 30 days supply | Qty: 15 | Fill #2

## 2019-06-24 MED FILL — traZODone HCL 50 MG TABS: 50 | 90 days supply | Qty: 90 | Fill #1

## 2019-06-24 MED FILL — LEVOTHYROXINE 88 MCG TABLET: 88 | 84 days supply | Qty: 96 | Fill #2

## 2019-07-19 MED FILL — LOSARTAN-HCTZ 100-25 MG TAB: 100-25 | 30 days supply | Qty: 15 | Fill #3

## 2019-07-24 MED FILL — TESTOSTERONE 50 MG/5 GRAM P: 50 MG/5GM | 30 days supply | Qty: 150 | Fill #0

## 2019-08-13 MED FILL — LOSARTAN-HCTZ 100-25 MG TAB: 100-25 | 30 days supply | Qty: 15 | Fill #4

## 2019-08-24 MED FILL — TESTOSTERONE 50 MG/5 GRAM P: 50 MG/5GM | 30 days supply | Qty: 150 | Fill #1

## 2019-09-01 ENCOUNTER — Ambulatory Visit: Payer: 59 | Attending: Internal Medicine

## 2019-09-01 DIAGNOSIS — Z23 Encounter for immunization: Secondary | ICD-10-CM | POA: Insufficient documentation

## 2019-09-01 NOTE — Progress Notes (Signed)
   Covid-19 Vaccination Clinic  Name:  Kirk [name]    MRN: [id number] DOB: 23-Jun-1956  09/01/2019  Mr. Kirk [name] was observed post Covid-19 immunization for 15 minutes without incidence. He was provided with Vaccine Information Sheet and instruction to access the V-Safe system.   Mr. Kirk [name] was instructed to call 911 with any severe reactions post vaccine: Marland Kitchen Difficulty breathing  . Swelling of your face and throat  . A fast heartbeat  . A bad rash all over your body  . Dizziness and weakness    Immunizations Administered    Name Date Dose VIS Date Route   Pfizer COVID-19 Vaccine 09/01/2019  6:22 PM 0.3 mL 07/02/2019 Intramuscular   Manufacturer: [place]   Lot: [id number]   [place]: [id number]

## 2019-09-15 MED FILL — SIMVASTATIN 40 MG TABLET: 40 | 90 days supply | Qty: 90 | Fill #1

## 2019-09-15 MED FILL — EZETIMIBE 10 MG TABS: 10 | 90 days supply | Qty: 90 | Fill #1

## 2019-09-15 MED FILL — LOSARTAN-HCTZ 100-25 MG TAB: 100-25 | 30 days supply | Qty: 15 | Fill #5

## 2019-09-15 MED FILL — LEVOTHYROXINE 88 MCG TABLET: 88 | 84 days supply | Qty: 96 | Fill #3

## 2019-09-15 MED FILL — traZODone HCL 50 MG TABS: 50 | 90 days supply | Qty: 90 | Fill #0

## 2019-09-18 NOTE — Progress Notes (Deleted)
Telehealth Visit  {Choose 1 Note Type (Telehealth Visit or Telephone Visit):[phone]}   Evaluation Performed:  Follow-up visit  This visit type was conducted due to national recommendations for restrictions regarding the COVID-19 Pandemic (e.g. social distancing).  This format is felt to be most appropriate for this patient at this time.  All issues noted in this document were discussed and addressed.  No physical exam was performed (except for noted visual exam findings with Video Visits).  Please refer to the patient's chart (MyChart message for video visits and phone note for telephone visits) for the patient's consent to telehealth for [facility].  Date:  09/18/2019   ID:  Kirk [name], DOB [birth year], MRN [id number]  Patient Location:  ***  Provider location:   ***  PCP:  [name], MD  Cardiologist:  Servando Snare & Sinclair Grooms, MD  Electrophysiologist:  None   Chief Complaint:  ***  History of Present Illness:    Kirk [name] is a 64 y.o. male who presents via audio/video conferencing for a telehealth visit today.  Seen for ***  with a hx of bifascicular block, essential hypertension (previously treated but not on therapy when initially seen for consultation), and avid hiker many times walking greater than 8 miles [place] the [place] carrying a 60 pound backpack.   Kirk [name] returns today with his wife.  He has a long list of questions concerning his health and also wants to discuss in detail the results of recent cardiac testing.  Cardiac testing was performed after we noted bifascicular block on EKG.  He was relatively asymptomatic.  We did note on exercise testing that he had hypertensive blood pressure response.  Furthermore there is a long-standing history of hypertension, which eventually was managed on diet and exercise without pharmacologic agents.  He has had no chest discomfort and denies dyspnea.  He has never had syncope. The patient {does/does not:200015} have  symptoms concerning for COVID-19 infection (fever, chills, cough, or new shortness of breath).   Seen today via ***. *** has consented for this visit.    Past Medical History:  Diagnosis Date  . Hypertension   . 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 DENTAL SURGERY    . TONSILLECTOMY       No outpatient medications have been marked as taking for the 09/22/19 encounter (Appointment) with Burtis Junes, NP.     Allergies:   Patient has no known allergies.   Social History   Tobacco Use  . Smoking status: Never Smoker  . Smokeless tobacco: Never Used  Substance Use Topics  . Alcohol use: Yes  . Drug use: No     Family Hx: The patient's family history includes Congestive Heart Failure in his father; Heart disease in his father; Parkinson's disease in his mother; Stroke in his mother.  ROS:   Please see the history of present illness.   All other systems reviewed are negative except for ***.    Objective:    Vital Signs:  There were no vitals taken for this visit.   Wt Readings from Last 3 Encounters:  05/12/18 178 lb (80.7 kg)  04/17/18 182 lb 6.4 oz (82.7 kg)  08/23/13 179 lb (81.2 kg)    Alert male in no acute distress.   Labs/Other Tests and Data Reviewed:    No results found for: WBC, HGB, HCT, PLT, GLUCOSE, CHOL, TRIG, HDL, LDLDIRECT, LDLCALC, ALT, AST, NA, K,  CL, CREATININE, BUN, CO2, TSH, PSA, INR, GLUF, HGBA1C, MICROALBUR   BNP (last 3 results) No results for input(s): BNP in the last 8760 hours.  ProBNP (last 3 results) No results for input(s): PROBNP in the last 8760 hours.    Prior CV studies:    The following studies were reviewed today:  *** Exercise treadmill test 04/23/2018:  Study Highlights    There was no ST segment deviation noted during stress.  The patient walked for 10:09 of a Bruce protocol GXT. He had a RBBB with associated ST / T wave abn at baseline  Peak HR of 141  which is 89% predictected maximal HR . BP response to exercise was hypertensive.  The baseline ST /T abnormalities worsened with exercise.  There were no no ST abnormalities to suggest ischemia  Good exercise capacity. no arrhythmias at peak exercise  Nondiagnostic GXT due to baseline ST /T wave abnormalities.   2D Doppler echocardiogram 04/23/2018:  Study Conclusions  - Left ventricle: The cavity size was normal. Systolic function was normal. The estimated ejection fraction was in the range of 60% to 65%. Wall motion was normal; there were no regional wall motion abnormalities. Doppler parameters are consistent with abnormal left ventricular relaxation (grade 1 diastolic dysfunction). - Aortic valve: Transvalvular velocity was within the normal range. There was no stenosis. There was no regurgitation. - Aorta: Ascending aortic diameter: 38 mm (S). - Ascending aorta: The ascending aorta was mildly dilated. - Mitral valve: Transvalvular velocity was within the normal range. There was no evidence for stenosis. There was no regurgitation. - Right ventricle: The cavity size was mildly dilated. Wall thickness was normal. Systolic function was normal. - Atrial septum: No defect or patent foramen ovale was identified by color flow Doppler. - Tricuspid valve: There was no regurgitation.    ASSESSMENT & PLAN:    1.   ASSESSMENT:    1. Right bundle branch block (RBBB) with left anterior hemiblock   2. Other hyperlipidemia   3. Essential hypertension   4. Abnormal EKG    PLAN:    In order of problems listed above:  1. Unchanged. 2. Lipids are excellent on Zetia. 3. Long discussion concerning blood pressure, targets, with goal blood pressure identified as 130/80 mmHg or less.  Low-salt diet discussed in detail.  Counseling and advice concerning level of activity, blood pressure target, lipid targets, natural history of conduction abnormality, and  clinical follow-up were discussed in detail.  He was instructed on proper timing of blood pressure monitoring in relationship to medication intake.  He was asked to avoid taking his blood pressure before antihypertensives have been taken or within the first hour after therapy is on board.  2 to 12 hours after medication is a sweet spot for blood pressure recording and these pressures can be more easily used for medication titration without concern for overmedicating.   Marland Kitchen COVID-19 Education: The signs and symptoms of COVID-19 were discussed with the patient and how to seek care for testing (follow up with PCP or arrange E-visit).  The importance of social distancing, staying at home, hand hygiene and wearing a mask when out in public were discussed today.  Patient Risk:   After full review of this patient's clinical status, I feel that they are at least moderate risk at this time.  Time:   Today, I have spent *** minutes with the patient with telehealth technology discussing the above issues.     Medication Adjustments/Labs and Tests Ordered: Current  medicines are reviewed at length with the patient today.  Concerns regarding medicines are outlined above.   Tests Ordered: No orders of the defined types were placed in this encounter.   Medication Changes: No orders of the defined types were placed in this encounter.   Disposition:  FU with *** in {gen number [id number] {Days to years:10300}.   Patient is agreeable to this plan and will call if any problems develop in the interim.   Amie Critchley, NP  09/18/2019 5:28 PM    [facility]

## 2019-09-21 MED FILL — TESTOSTERONE 50 MG/5 GRAM P: 50 MG/5GM | 30 days supply | Qty: 150 | Fill #0

## 2019-09-22 ENCOUNTER — Telehealth: Payer: 59 | Admitting: Nurse Practitioner

## 2019-09-25 ENCOUNTER — Ambulatory Visit: Payer: 59 | Attending: Internal Medicine

## 2019-09-25 DIAGNOSIS — Z23 Encounter for immunization: Secondary | ICD-10-CM | POA: Insufficient documentation

## 2019-09-25 NOTE — Progress Notes (Signed)
   Covid-19 Vaccination Clinic  Name:  Kirk [name]    MRN: [id number] DOB: [birth year]  09/25/2019  Kirk [name] was observed post Covid-19 immunization for 15 minutes without incident. He was provided with Vaccine Information Sheet and instruction to access the V-Safe system.   Kirk [name] was instructed to call 911 with any severe reactions post vaccine: Marland Kitchen Difficulty breathing  . Swelling of face and throat  . A fast heartbeat  . A bad rash all over body  . Dizziness and weakness   Immunizations Administered    Name Date Dose VIS Date Route   Pfizer COVID-19 Vaccine 09/25/2019  9:53 AM 0.3 mL 07/02/2019 Intramuscular   Manufacturer: [place]   Lot: [id number]   [place]: [id number]

## 2019-10-06 ENCOUNTER — Other Ambulatory Visit: Payer: Self-pay | Admitting: Interventional Cardiology

## 2019-10-09 MED FILL — LOSARTAN-HCTZ 100-25 MG TAB: 100-25 | 7 days supply | Qty: 7 | Fill #0

## 2019-10-19 MED FILL — TESTOSTERONE 50 MG/5 GRAM P: 50 MG/5GM | 30 days supply | Qty: 150 | Fill #1

## 2019-11-03 ENCOUNTER — Other Ambulatory Visit: Payer: Self-pay | Admitting: Interventional Cardiology

## 2019-11-05 DIAGNOSIS — E7849 Other hyperlipidemia: Secondary | ICD-10-CM | POA: Diagnosis not present

## 2019-11-05 DIAGNOSIS — E559 Vitamin D deficiency, unspecified: Secondary | ICD-10-CM | POA: Diagnosis not present

## 2019-11-05 DIAGNOSIS — R7301 Impaired fasting glucose: Secondary | ICD-10-CM | POA: Diagnosis not present

## 2019-11-05 DIAGNOSIS — E291 Testicular hypofunction: Secondary | ICD-10-CM | POA: Diagnosis not present

## 2019-11-05 DIAGNOSIS — E236 Other disorders of pituitary gland: Secondary | ICD-10-CM | POA: Diagnosis not present

## 2019-11-05 DIAGNOSIS — E038 Other specified hypothyroidism: Secondary | ICD-10-CM | POA: Diagnosis not present

## 2019-11-10 ENCOUNTER — Other Ambulatory Visit (HOSPITAL_COMMUNITY): Payer: Self-pay | Admitting: Endocrinology

## 2019-11-10 DIAGNOSIS — F5221 Male erectile disorder: Secondary | ICD-10-CM | POA: Diagnosis not present

## 2019-11-10 DIAGNOSIS — Z1389 Encounter for screening for other disorder: Secondary | ICD-10-CM | POA: Diagnosis not present

## 2019-11-10 DIAGNOSIS — E038 Other specified hypothyroidism: Secondary | ICD-10-CM | POA: Diagnosis not present

## 2019-11-10 DIAGNOSIS — K635 Polyp of colon: Secondary | ICD-10-CM | POA: Diagnosis not present

## 2019-11-10 DIAGNOSIS — D751 Secondary polycythemia: Secondary | ICD-10-CM | POA: Diagnosis not present

## 2019-11-10 DIAGNOSIS — E559 Vitamin D deficiency, unspecified: Secondary | ICD-10-CM | POA: Diagnosis not present

## 2019-11-10 DIAGNOSIS — R251 Tremor, unspecified: Secondary | ICD-10-CM | POA: Diagnosis not present

## 2019-11-10 DIAGNOSIS — I5189 Other ill-defined heart diseases: Secondary | ICD-10-CM | POA: Diagnosis not present

## 2019-11-10 DIAGNOSIS — E236 Other disorders of pituitary gland: Secondary | ICD-10-CM | POA: Diagnosis not present

## 2019-11-10 DIAGNOSIS — Z0000 Encounter for general adult medical examination without abnormal findings: Secondary | ICD-10-CM | POA: Diagnosis not present

## 2019-11-10 MED FILL — EPINEPHRINE 0.3 MG AUTO-INJ: 0.3 | 30 days supply | Qty: 2 | Fill #0

## 2019-11-10 MED FILL — LOSARTAN-HCTZ 50-12.5 MG TA: 50-12.5 | 90 days supply | Qty: 90 | Fill #0

## 2019-11-10 MED FILL — TADALAFIL 20 MG TABS: 20 | 30 days supply | Qty: 6 | Fill #0

## 2019-11-26 MED FILL — TESTOSTERONE 50 MG/5 GRAM P: 50 MG/5GM | 30 days supply | Qty: 150 | Fill #2

## 2019-12-08 MED FILL — traZODone HCL 50 MG TABS: 50 | 90 days supply | Qty: 90 | Fill #1

## 2019-12-08 MED FILL — LEVOTHYROXINE 88 MCG TABLET: 88 | 84 days supply | Qty: 96 | Fill #0

## 2019-12-08 MED FILL — SIMVASTATIN 40 MG TABLET: 40 | 90 days supply | Qty: 90 | Fill #0

## 2019-12-08 MED FILL — EZETIMIBE 10 MG TABS: 10 | 90 days supply | Qty: 90 | Fill #0

## 2020-01-25 MED FILL — TESTOSTERONE 50 MG/5 GRAM P: 50 MG/5GM | 30 days supply | Qty: 150 | Fill #3

## 2020-02-10 MED FILL — LOSARTAN-HCTZ 50-12.5 MG TA: 50-12.5 | 90 days supply | Qty: 90 | Fill #1

## 2020-02-25 MED FILL — TESTOSTERONE 50 MG/5 GRAM P: 50 MG/5GM | 30 days supply | Qty: 150 | Fill #4

## 2020-03-06 MED FILL — TESTOSTERONE 50 MG/5 GRAM P: 50 MG/5GM | 30 days supply | Qty: 150 | Fill #0

## 2020-03-30 MED FILL — TESTOSTERONE 50 MG/5GM (1%): 50 MG/5GM | 30 days supply | Qty: 150 | Fill #0

## 2020-04-28 MED FILL — TESTOSTERONE 50 MG/5 GRAM P: 50 MG/5GM | 30 days supply | Qty: 150 | Fill #0

## 2020-05-02 MED FILL — LOSARTAN-HCTZ 50-12.5 MG TA: 50-12.5 | 90 days supply | Qty: 90 | Fill #2

## 2020-05-04 ENCOUNTER — Other Ambulatory Visit (HOSPITAL_COMMUNITY): Payer: Self-pay | Admitting: Internal Medicine

## 2020-05-04 MED FILL — FLUARIX QUADRIVALENT 0.5 ML: 0.5 | 1 days supply | Qty: 1 | Fill #0

## 2020-05-18 DIAGNOSIS — E785 Hyperlipidemia, unspecified: Secondary | ICD-10-CM | POA: Diagnosis not present

## 2020-05-18 DIAGNOSIS — R7301 Impaired fasting glucose: Secondary | ICD-10-CM | POA: Diagnosis not present

## 2020-05-18 DIAGNOSIS — E559 Vitamin D deficiency, unspecified: Secondary | ICD-10-CM | POA: Diagnosis not present

## 2020-05-18 DIAGNOSIS — Z0000 Encounter for general adult medical examination without abnormal findings: Secondary | ICD-10-CM | POA: Diagnosis not present

## 2020-05-18 DIAGNOSIS — E039 Hypothyroidism, unspecified: Secondary | ICD-10-CM | POA: Diagnosis not present

## 2020-05-18 DIAGNOSIS — Z125 Encounter for screening for malignant neoplasm of prostate: Secondary | ICD-10-CM | POA: Diagnosis not present

## 2020-05-20 ENCOUNTER — Ambulatory Visit: Payer: 59 | Attending: Internal Medicine

## 2020-05-20 DIAGNOSIS — Z23 Encounter for immunization: Secondary | ICD-10-CM

## 2020-05-20 NOTE — Progress Notes (Signed)
   Covid-19 Vaccination Clinic  Name:  QUINT CHESTNUT    MRN: [id number] DOB: 18-Feb-1956  05/20/2020  Kirk [name] was observed post Covid-19 immunization for 15 minutes without incident. He was provided with Vaccine Information Sheet and instruction to access the V-Safe system.   Mr. Barkan was instructed to call 911 with any severe reactions post vaccine: Marland Kitchen Difficulty breathing  . Swelling of face and throat  . A fast heartbeat  . A bad rash all over body  . Dizziness and weakness

## 2020-05-22 ENCOUNTER — Other Ambulatory Visit (HOSPITAL_COMMUNITY): Payer: Self-pay | Admitting: Endocrinology

## 2020-05-22 MED FILL — EZETIMIBE 10 MG TABS: 10 | 90 days supply | Qty: 90 | Fill #0

## 2020-05-22 MED FILL — traZODone HCL 50 MG TABS: 50 | 90 days supply | Qty: 90 | Fill #0

## 2020-05-22 MED FILL — SIMVASTATIN 40 MG TABLET: 40 | 90 days supply | Qty: 90 | Fill #0

## 2020-05-22 MED FILL — LEVOTHYROXINE 88 MCG TABLET: 88 | 84 days supply | Qty: 96 | Fill #0

## 2020-05-24 DIAGNOSIS — R251 Tremor, unspecified: Secondary | ICD-10-CM | POA: Diagnosis not present

## 2020-05-24 DIAGNOSIS — Z0000 Encounter for general adult medical examination without abnormal findings: Secondary | ICD-10-CM | POA: Diagnosis not present

## 2020-05-24 DIAGNOSIS — F5221 Male erectile disorder: Secondary | ICD-10-CM | POA: Diagnosis not present

## 2020-05-24 DIAGNOSIS — R82998 Other abnormal findings in urine: Secondary | ICD-10-CM | POA: Diagnosis not present

## 2020-05-24 DIAGNOSIS — E559 Vitamin D deficiency, unspecified: Secondary | ICD-10-CM | POA: Diagnosis not present

## 2020-05-24 DIAGNOSIS — I7300 Raynaud's syndrome without gangrene: Secondary | ICD-10-CM | POA: Diagnosis not present

## 2020-05-24 DIAGNOSIS — E236 Other disorders of pituitary gland: Secondary | ICD-10-CM | POA: Diagnosis not present

## 2020-05-24 DIAGNOSIS — Z1339 Encounter for screening examination for other mental health and behavioral disorders: Secondary | ICD-10-CM | POA: Diagnosis not present

## 2020-05-24 DIAGNOSIS — Z1331 Encounter for screening for depression: Secondary | ICD-10-CM | POA: Diagnosis not present

## 2020-05-24 DIAGNOSIS — I452 Bifascicular block: Secondary | ICD-10-CM | POA: Diagnosis not present

## 2020-05-24 DIAGNOSIS — I5189 Other ill-defined heart diseases: Secondary | ICD-10-CM | POA: Diagnosis not present

## 2020-05-24 DIAGNOSIS — E785 Hyperlipidemia, unspecified: Secondary | ICD-10-CM | POA: Diagnosis not present

## 2020-05-26 MED FILL — TESTOSTERONE 50 MG/5 GRAM P: 50 MG/5GM | 30 days supply | Qty: 150 | Fill #1

## 2020-06-19 ENCOUNTER — Other Ambulatory Visit: Payer: Self-pay

## 2020-06-19 ENCOUNTER — Ambulatory Visit (INDEPENDENT_AMBULATORY_CARE_PROVIDER_SITE_OTHER): Payer: 59

## 2020-06-19 ENCOUNTER — Encounter: Payer: Self-pay | Admitting: Podiatry

## 2020-06-19 ENCOUNTER — Ambulatory Visit (INDEPENDENT_AMBULATORY_CARE_PROVIDER_SITE_OTHER): Payer: 59 | Admitting: Podiatry

## 2020-06-19 DIAGNOSIS — M205X9 Other deformities of toe(s) (acquired), unspecified foot: Secondary | ICD-10-CM

## 2020-06-19 DIAGNOSIS — M79672 Pain in left foot: Secondary | ICD-10-CM

## 2020-06-19 DIAGNOSIS — D3610 Benign neoplasm of peripheral nerves and autonomic nervous system, unspecified: Secondary | ICD-10-CM | POA: Diagnosis not present

## 2020-06-19 DIAGNOSIS — M79671 Pain in right foot: Secondary | ICD-10-CM | POA: Diagnosis not present

## 2020-06-19 NOTE — Patient Instructions (Signed)
Hallux Rigidus  Hallux rigidus is a type of joint pain or joint disease (arthritis) that affects your big toe (hallux). This condition involves the joint that connects the base of your big toe to the main part of your foot (metatarsophalangeal joint or MTP joint). This condition can cause your big toe to become stiff, painful, and difficult to move. Symptoms may get worse with movement or in cold or damp weather. The condition gets worse over time. What are the causes? This condition may be caused by having a foot that does not function the way that it should or that has an abnormal shape (structural deformity). These foot problems can run in families and may be passed down from parents to children (are hereditary). This condition can also be caused by:  Injury.  Overuse.  Certain inflammatory diseases, including gout and rheumatoid arthritis. What increases the risk? You are more likely to develop this condition if you have:  A foot bone (metatarsal) that is longer or higher than normal.  A family history of hallux rigidus.  Previously injured your big toe.  Feet that do not have a curve (arch) on the inner side of the foot. This may be called flat feet or fallen arches.  Ankles that turn in when you walk (pronation).  Rheumatoid arthritis or gout.  A job that requires you to stoop down often at work. What are the signs or symptoms? Symptoms of this condition include:  Big toe pain.  Stiffness and difficulty moving the big toe.  Swelling of the toe and surrounding area.  Bone spurs. These are bony growths that can form on the joint of the big toe.  A limp. How is this diagnosed? This condition is diagnosed based on your medical history and a physical exam. You may also have X-rays. How is this treated? This condition is treated by:  Wearing roomy, comfortable shoes that have a large toe box.  Putting orthotic devices in your shoes.  Taking pain medicines.  Having  physical therapy.  Icing the injured area.  Alternating between putting your foot in cold water and then in warm water. If your condition is severe, treatment may include:  Corticosteroid injections to relieve pain.  Surgery to remove bone spurs, fuse damaged bones together, or replace the entire joint. Follow these instructions at home: Managing pain, stiffness, and swelling   Put your feet in cold water for 30 seconds, and then in warm water for 30 seconds. Alternate between the cold and warm water for 5 minutes. Do this several times a day or as told by your health care provider.  If directed, put ice on the injured area. ? Put ice in a plastic bag. ? Place a towel between your skin and the bag. ? Leave the ice on for 20 minutes, 2-3 times a day. General instructions  Take over-the-counter and prescription medicines only as told by your health care provider.  Do not wear high heels or other restrictive footwear. Wear comfortable, supportive shoes that have a large toe box.  Wear shoe inserts (orthotics) as told by your health care provider, if this applies.  Do foot exercises as instructed by your health care provider or a physical therapist.  Keep all follow-up visits as told by your health care provider. This is important. Contact a health care provider if:  You notice bone spurs or growths on or around your big toe.  Your pain does not get better or it gets worse.  You have  pain while resting.  You have pain in other parts of your body, such as your back, hip, or knee.  You start to limp. Summary  Hallux rigidus is a condition that makes your big toe become stiff, painful, and difficult to move.  It can be caused by injury, overuse, or inflammatory diseases.  This condition may be treated with ice, medicines, physical therapy, and surgery.  Do not wear high heels or other restrictive footwear. Wear comfortable, supportive shoes that have a large toe box. This  information is not intended to replace advice given to you by your health care provider. Make sure you discuss any questions you have with your health care provider. Document Revised: 04/17/2018 Document Reviewed: 04/20/2018 Elsevier Patient Education  [place] Neuralgia  [place] Lathe neuralgia is foot pain that affects the ball of the foot and the area near the toes. Morton neuralgia occurs when part of a nerve in the foot (digital nerve) is under too much pressure (compressed). When this happens over a long period of time, the nerve can thicken (neuroma) and cause pain. Pain usually occurs between the third and fourth toes.  Morton neuralgia can come and go but may get worse over time. What are the causes? This condition is caused by doing the same things over and over with your foot, such as:  Activities such as running or jumping.  Wearing shoes that are too tight. What increases the risk? You may be at higher risk for Morton neuralgia if you:  Are male.  Wear high heels.  Wear shoes that are narrow or tight.  Do activities that repeatedly stretch your toes, such as: ? Running. ? [place]. ? Long-distance walking. What are the signs or symptoms? The first symptom of Morton neuralgia is pain that spreads from the ball of the foot to the toes. It may feel like you are walking on a marble. Pain usually gets worse with walking and goes away at night. Other symptoms may include numbness and cramping of your toes. Both feet are equally affected, but rarely at the same time. How is this diagnosed? This condition is diagnosed based on your symptoms, your medical history, and a physical exam. Your health care provider may:  Squeeze your foot just behind your toe.  Ask you to move your toes to check for pain.  Ask about your physical activity level. You also may have imaging tests, such as an X-ray, ultrasound, or MRI. How is this treated? Treatment depends on how severe  your condition is and what causes it. Treatment may involve:  Wearing different shoes that are not too tight, are low-heeled, and provide good support. For some people, this is the only treatment needed.  Wearing an over-the-counter or custom supportive pad (orthotic) under the front of your foot.  Getting injections of numbing medicine and anti-inflammatory medicine (steroid) in the nerve.  Having surgery to remove part of the thickened nerve. Follow these instructions at home: Managing pain, stiffness, and swelling   Massage your foot as needed.  Wear orthotics as told by your health care provider.  If directed, put ice on your foot: ? Put ice in a plastic bag. ? Place a towel between your skin and the bag. ? Leave the ice on for 20 minutes, 2-3 times a day.  Avoid activities that cause pain or make pain worse. If you play sports, ask your health care provider when it is safe for you to return to sports.  Raise (elevate)  your foot above the level of your heart while lying down and, when possible, while sitting. General instructions  Take over-the-counter and prescription medicines only as told by your health care provider.  Do not drive or use heavy machinery while taking prescription pain medicine.  Wear shoes that: ? Have soft soles. ? Have a wide toe area. ? Provide arch support. ? Do not pinch or squeeze your feet. ? Have room for your orthotics, if applicable.  Keep all follow-up visits as told by your health care provider. This is important. Contact a health care provider if:  Your symptoms get worse or do not get better with treatment and home care. Summary  Morton neuralgia is foot pain that affects the ball of the foot and the area near the toes. Pain usually occurs between the third and fourth toes, gets worse with walking, and goes away at night.  Morton neuralgia occurs when part of a nerve in the foot (digital nerve) is under too much pressure. When this  happens over a long period of time, the nerve can thicken (neuroma) and cause pain.  This condition is caused by doing the same things over and over with your foot, such as running or jumping, wearing shoes that are too tight, or wearing high heels.  Treatment may involve wearing low-heeled shoes that are not too tight, wearing a supportive pad (orthotic) under the front of your foot, getting injections in the nerve, or having surgery to remove part of the thickened nerve. This information is not intended to replace advice given to you by your health care provider. Make sure you discuss any questions you have with your health care provider. Document Revised: 07/22/2017 Document Reviewed: 07/22/2017 Elsevier Patient Education  2020 [employer].

## 2020-06-20 NOTE — Progress Notes (Signed)
Subjective:   Patient ID: Kirk [name], male   DOB: 64 y.o.   MRN: [id number]   HPI Patient presents stating he is very active and is developed a burning type discomfort mostly in the left foot and states also he can have occasional trouble with his big toe joints. States that he likes to hike and do activities and states that the numbing burning pain can be bothersome and its been going on for a while. Patient does not smoke likes to be active   Review of Systems  All other systems reviewed and are negative.       Objective:  Physical Exam Vitals and nursing note reviewed.  Constitutional:      Appearance: He is well-developed.  Pulmonary:     Effort: Pulmonary effort is normal.  Musculoskeletal:        General: Normal range of motion.  Skin:    General: Skin is warm.  Neurological:     Mental Status: He is alert.     Neurovascular status intact muscle strength found to be adequate range of motion within normal limits. Patient is noted to have the probability for neuroma-like symptomatology third interspace left foot with mild discomfort in the metatarsal phalangeal joints but appears to be mostly in the interspace with reduced range of motion first MPJ bilateral with what appears to be dorsal spur formation. 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 I educated him on possible neuroma and also hallux limitus deformity and I am focusing on the left foot today. I did do sterile prep I injected the third interspace with a nerve block steroid injection and I want to see the results over 2 weeks which may dictate what we may need to do. Also discussed orthotics with a Morton's extension for reverse Morton's to try to help with the big toe joint arthritis that he is developing  X-rays indicate spurring around the first metatarsal head  right over left with moderate elevation first metatarsal segment with no other bone pathology noted

## 2020-06-22 MED FILL — TESTOSTERONE 50 MG/5 GRAM P: 50 MG/5GM | 30 days supply | Qty: 150 | Fill #2

## 2020-06-28 DIAGNOSIS — Z1212 Encounter for screening for malignant neoplasm of rectum: Secondary | ICD-10-CM | POA: Diagnosis not present

## 2020-07-03 ENCOUNTER — Ambulatory Visit (INDEPENDENT_AMBULATORY_CARE_PROVIDER_SITE_OTHER): Payer: 59 | Admitting: Podiatry

## 2020-07-03 ENCOUNTER — Other Ambulatory Visit: Payer: Self-pay

## 2020-07-03 ENCOUNTER — Encounter: Payer: Self-pay | Admitting: Podiatry

## 2020-07-03 DIAGNOSIS — D3610 Benign neoplasm of peripheral nerves and autonomic nervous system, unspecified: Secondary | ICD-10-CM | POA: Diagnosis not present

## 2020-07-04 ENCOUNTER — Encounter: Payer: Self-pay | Admitting: Podiatry

## 2020-07-05 NOTE — Progress Notes (Signed)
Subjective:   Patient ID: Kirk [name], male   DOB: 64 y.o.   MRN: [id number]   HPI Patient presents stating I seem to get some relief from what you did last time but I still know there is something there and it seems like it still pops in that area   ROS      Objective:  Physical Exam  Neurovascular status intact with continued symptoms which seem to be centered in the third interspace of the left foot with positive Biagio Borg sign noted with pressure to the area     Assessment:  Probability of neuroma symptomatology left even though I cannot rule out any other kind of neuropathic or possible inflammatory condition     Plan:  H&P reviewed condition at great length.  I recommended a neurolysis injection but I did discuss possibility long-term of excision of the mass and I explained procedure risk.  Today I went ahead and I did sterile prep and injected the third interspace with a purified alcohol Marcaine solution and advised on wider type of shoes.  We will either continue neurolysis treatment or consider excision and patient will make a decision based on the response to this conservative treatment pattern

## 2020-07-10 ENCOUNTER — Telehealth: Payer: Self-pay

## 2020-07-10 NOTE — Telephone Encounter (Signed)
DOS 07/18/2020  NEURECTOMY 3RD LT - 28080  [facility] EFFECTIVE DATE - 07/18/2020  PLAN DEDUCTIBLE - DOESN'T APPLY TO PLAN OUT OF POCKET - $4000.00 W/ $352.86 REMAINING COPAY $0.00 COINSURANCE - 60%  SPOKE TO HANNAH AT [facility], SHE STATED NO PRECERT IS REQUIRED FOR CPT 28080. CALL REF # [id number]

## 2020-07-17 ENCOUNTER — Encounter: Payer: Self-pay | Admitting: Podiatry

## 2020-07-17 ENCOUNTER — Ambulatory Visit (INDEPENDENT_AMBULATORY_CARE_PROVIDER_SITE_OTHER): Payer: 59 | Admitting: Podiatry

## 2020-07-17 ENCOUNTER — Other Ambulatory Visit: Payer: Self-pay

## 2020-07-17 ENCOUNTER — Other Ambulatory Visit (HOSPITAL_COMMUNITY): Payer: Self-pay | Admitting: Endocrinology

## 2020-07-17 DIAGNOSIS — M205X9 Other deformities of toe(s) (acquired), unspecified foot: Secondary | ICD-10-CM | POA: Diagnosis not present

## 2020-07-17 DIAGNOSIS — D3610 Benign neoplasm of peripheral nerves and autonomic nervous system, unspecified: Secondary | ICD-10-CM | POA: Diagnosis not present

## 2020-07-17 DIAGNOSIS — M779 Enthesopathy, unspecified: Secondary | ICD-10-CM

## 2020-07-17 NOTE — Progress Notes (Signed)
Subjective:   Patient ID: Kirk [name], male   DOB: 64 y.o.   MRN: [id number]   HPI Patient presents stating he still feels like the burning is on the outside of his fourth toe left knee is concerned about whether the surgery is going to make him better.  Had numerous questions concerning this and also is concerned about flatfoot deformity and its relationship to his problems   ROS      Objective:  Physical Exam  Neurovascular status intact with patient having a palpable click of the third interspace left but very difficult to elicit symptoms and I was not able to elicit in the fourth either and there is no change between the fifth and fourth toe interface with patient noted to have moderate flatfoot deformity and also hallux limitus deformity bilateral      Assessment:  Probability for some form of neuroma symptomatology left but I cannot make complete determination as to what this may be and possibility for tendinitis with hallux limitus creating more stress on the outside of his foot     Plan:  H&P spent a great deal time going over with him different treatment options and at this point working to go ahead and make him orthotics to try to lift his arch and then also make him with most likely a reverse Morton's extension to try to take pressure off the medial side of the foot and if this does not improve his condition will have to consider surgery or possibly MRI.  He is to see ped orthotist tomorrow to have the orthotics crafted in the above fashion described

## 2020-07-18 ENCOUNTER — Other Ambulatory Visit: Payer: Self-pay | Admitting: Podiatry

## 2020-07-18 ENCOUNTER — Other Ambulatory Visit: Payer: 59 | Admitting: Orthotics

## 2020-07-18 DIAGNOSIS — M205X9 Other deformities of toe(s) (acquired), unspecified foot: Secondary | ICD-10-CM

## 2020-07-18 DIAGNOSIS — D3610 Benign neoplasm of peripheral nerves and autonomic nervous system, unspecified: Secondary | ICD-10-CM

## 2020-07-18 DIAGNOSIS — M779 Enthesopathy, unspecified: Secondary | ICD-10-CM

## 2020-07-20 MED FILL — TESTOSTERONE 50 MG/5 GRAM P: 50 MG/5GM | 30 days supply | Qty: 150 | Fill #0

## 2020-07-24 ENCOUNTER — Encounter: Payer: 59 | Admitting: Podiatry

## 2020-07-25 ENCOUNTER — Other Ambulatory Visit (HOSPITAL_COMMUNITY): Payer: Self-pay | Admitting: Endocrinology

## 2020-07-25 MED FILL — LOSARTAN-HCTZ 50-12.5 MG TA: 50-12.5 | 30 days supply | Qty: 30 | Fill #0

## 2020-08-15 ENCOUNTER — Other Ambulatory Visit (HOSPITAL_COMMUNITY): Payer: Self-pay | Admitting: Endocrinology

## 2020-08-15 MED FILL — SIMVASTATIN 40 MG TABLET: 40 | 90 days supply | Qty: 90 | Fill #1

## 2020-08-15 MED FILL — EZETIMIBE 10 MG TABS: 10 | 90 days supply | Qty: 90 | Fill #1

## 2020-08-15 MED FILL — traZODone HCL 50 MG TABS: 50 | 90 days supply | Qty: 90 | Fill #1

## 2020-08-15 MED FILL — LEVOTHYROXINE 88 MCG TABLET: 88 | 84 days supply | Qty: 96 | Fill #0

## 2020-08-17 ENCOUNTER — Other Ambulatory Visit: Payer: 59 | Admitting: Orthotics

## 2020-08-17 ENCOUNTER — Other Ambulatory Visit: Payer: Self-pay

## 2020-08-18 MED FILL — TESTOSTERONE 50 MG/5 GRAM P: 50 MG/5GM | 30 days supply | Qty: 150 | Fill #1

## 2020-08-26 NOTE — Progress Notes (Signed)
[facility] [address] [place] Phone: [phone] Subjective:   I Kandace Blitz am serving as a Education administrator for Dr. Hulan Saas.  This visit occurred during the SARS-CoV-2 public health emergency.  Safety protocols were in place, including screening questions prior to the visit, additional usage of staff PPE, and extensive cleaning of exam room while observing appropriate contact time as indicated for disinfecting solutions.   I'm seeing this patient by the request  of:  [name], MD  CC: foot pain   [id number]  Kirk [name] is a 65 y.o. male coming in with complaint of bilateral foot pain. Patient does a lot of walking. 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 Orthotics made by podiatrist/pedorthist. Patients states he has always had flat feet. States he also has poor circulation. Around his 64s he had more issues with his left foot and states the smaller toes rub and cramp. Most shoes and boots are uncomfortable. Patient enjoys hiking. Has tried trail running shoes for hiking and believes that contributed the foot pain. Hiked successfully last June and since then he has had issues. After a trip on November his feet were TTP. Has a pinched nerve between the 4th and 5th toe. Received an injection on the left foot in the area. Patient has bone spurs and arthritis.   Onset- Chronic Location - bilateral feet Duration-  Character- sharp, dull, burning  Aggravating factors- hiking  Reliving factors-  Therapies tried- voltaren, pennsaid, orthotics Severity- under 5/10 at its worse    Seen podiatry and diagnosed with a nueroma   Past Medical History:  Diagnosis Date  . Hypertension   . 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 DENTAL SURGERY    . TONSILLECTOMY     Social History   Socioeconomic History  . Marital status: Married    Spouse name: Not on file  .  Number of children: Not on file  . Years of education: Not on file  . Highest education level: Not on file  Occupational History  . Not on file  Tobacco Use  . Smoking status: Never Smoker  . Smokeless tobacco: Never Used  Substance and Sexual Activity  . Alcohol use: Yes  . Drug use: No  . Sexual activity: Not on file  Other Topics Concern  . Not on file  Social History Narrative  . Not on file   Social Determinants of Health   Financial Resource Strain: Not on file  Food Insecurity: Not on file  Transportation Needs: Not on file  Physical Activity: Not on file  Stress: Not on file  Social Connections: Not on file   No Known Allergies Family History  Problem Relation Age of Onset  . Congestive Heart Failure Father   . Heart disease Father   . Parkinson's disease Mother   . Stroke Mother     Current Outpatient Medications (Endocrine & Metabolic):  .  levothyroxine (SYNTHROID, LEVOTHROID) 88 MCG tablet, Take 88 mcg by mouth daily before breakfast. .  testosterone (ANDROGEL) 50 MG/5GM (1%) GEL, Apply 5 grams onto the skin daily.  Current Outpatient Medications (Cardiovascular):  .  amLODipine (NORVASC) 5 MG tablet, Take 1 tablet (5 mg total) by mouth daily. Marland Kitchen  ezetimibe (ZETIA) 10 MG tablet, Take 10 mg by mouth daily. Marland Kitchen  losartan-hydrochlorothiazide (HYZAAR) 100-25 MG tablet, Take 0.5 tablets by mouth daily. Please make overdue appt with Dr.   before anymore refills. 2nd attempt .  simvastatin (ZOCOR) 40 MG tablet, Take 40 mg by mouth daily.   Current Outpatient Medications (Analgesics):  .  aspirin EC 81 MG tablet, Take 81 mg by mouth daily. .  Naproxen Sodium 220 MG CAPS, Take 220 mg by mouth 2 (two) times daily as needed (pain).   Current Outpatient Medications (Other):  Marland Kitchen  Melatonin 5 MG TABS, Take 5 mg by mouth at bedtime. .  Multiple Vitamins-Minerals (CENTRUM SILVER PO), Take half (1/2) tablet by mouth every other day. .  traZODone (DESYREL) 50 MG  tablet, Take 50 mg by mouth at bedtime.   Reviewed prior external information including notes and imaging from  primary care provider As well as notes that were available from care everywhere and other healthcare systems.  Past medical history, social, surgical and family history all reviewed in electronic medical record.  No pertanent information unless stated regarding to the chief complaint.   Review of Systems:  No headache, visual changes, nausea, vomiting, diarrhea, constipation, dizziness, abdominal pain, skin rash, fevers, chills, night sweats, weight loss, swollen lymph nodes, body aches, joint swelling, chest pain, shortness of breath, mood changes. POSITIVE muscle aches  Objective  Blood pressure 120/80, pulse 66, height 5\' 9"  (1.753 m), weight 190 lb (86.2 kg), SpO2 96 %.   General: No apparent distress alert and oriented x3 mood and affect normal, dressed appropriately.  HEENT: Pupils equal, extraocular movements intact  Respiratory: Patient's speak in full sentences and does not appear short of breath  Cardiovascular: No lower extremity edema, non tender, no erythema  Gait normal with good balance and coordination.  MSK: Mild arthritic changes of multiple joints Foot exam bilaterally does show the patient does have some mild overpronation of the hindfoot left greater than right.  Patient does have breakdown of the transverse arch bilaterally.  Left foot does have positive squeeze test noted minorly.  Patient does have good range of motion of the ankle noted.  Limited musculoskeletal ultrasound was performed and interpreted by Lyndal Pulley  Limited ultrasound of patient's left foot shows that there is a potential neuroma noted with hypoechoic changes between the fourth and fifth metatarsal heads.  With compression this does cause some discomfort and pain in the patient.  No cortical irregularity of the metatarsals themselves. Impression: Consistent with neuroma  75643; 15  additional minutes spent for Therapeutic exercises as stated in above notes.  This included exercises focusing on stretching, strengthening, with significant focus on eccentric aspects.   Long term goals include an improvement in range of motion, strength, endurance as well as avoiding reinjury. Patient's frequency would include in 1-2 times a day, 3-5 times a week for a duration of 6-12 weeks. Exercises for the foot include:  Stretches to help lengthen the lower leg and plantar fascia areas Theraband exercises for the lower leg and ankle to help strengthen the surrounding area- dorsiflexion, plantarflexion, inversion, eversion Massage rolling on the plantar surface of the foot with a frozen bottle, tennis ball or golf ball Towel or marble pick-ups to strengthen the plantar surface of the foot Weight bearing exercises to increase balance and overall stability   Proper technique shown and discussed handout in great detail with ATC.  All questions were discussed and answered.     Impression and Recommendations:     The above documentation has been reviewed and is accurate and complete Lyndal Pulley, DO

## 2020-08-28 ENCOUNTER — Encounter: Payer: Self-pay | Admitting: Family Medicine

## 2020-08-28 ENCOUNTER — Other Ambulatory Visit: Payer: Self-pay

## 2020-08-28 ENCOUNTER — Ambulatory Visit (INDEPENDENT_AMBULATORY_CARE_PROVIDER_SITE_OTHER): Payer: 59 | Admitting: Family Medicine

## 2020-08-28 ENCOUNTER — Ambulatory Visit: Payer: Self-pay

## 2020-08-28 VITALS — BP 120/80 | HR 66 | Ht 69.0 in | Wt 190.0 lb

## 2020-08-28 DIAGNOSIS — G5762 Lesion of plantar nerve, left lower limb: Secondary | ICD-10-CM

## 2020-08-28 DIAGNOSIS — M79672 Pain in left foot: Secondary | ICD-10-CM | POA: Diagnosis not present

## 2020-08-28 DIAGNOSIS — M216X9 Other acquired deformities of unspecified foot: Secondary | ICD-10-CM | POA: Diagnosis not present

## 2020-08-28 DIAGNOSIS — G5782 Other specified mononeuropathies of left lower limb: Secondary | ICD-10-CM | POA: Insufficient documentation

## 2020-08-28 DIAGNOSIS — M79645 Pain in left finger(s): Secondary | ICD-10-CM

## 2020-08-28 NOTE — Assessment & Plan Note (Signed)
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  Patient given different treatment options.  Patient has been seen to respond fairly well though to the new custom orthotics.  Discussed with patient about wearing these, other over-the-counter orthotics, proper lacing of the shoes, home exercises to work on the transverse arch and start formal physical therapy.  Worsening symptoms we can consider the possibility of gabapentin or further work-up with ABI and amlodipine.  Patient is in agreement with the plan will follow up again in 6 weeks

## 2020-08-28 NOTE — Assessment & Plan Note (Signed)
Breakdown of the transverse arch bilaterally with overpronation of the hindfoot.  Discussed the importance of wearing the custom orthotics he has already over-the-counter orthotics avoiding being barefoot.  We will start with physical therapy.  Worsening pain consider gabapentin consider work-up for ABI.

## 2020-08-28 NOTE — Patient Instructions (Addendum)
Good to see you Recovery sandals in the house Hoka/Oofos Spenco orthotics  Don't lace last eye on shoes PT at [facility] [place] Warm bath of feet before bed Exercises of transverse arch Continue to wear custom orthotics as I believe these are helping See me again in 6 weeks if not better we with discuss gabapentin, amlodipine and ABI test

## 2020-08-29 ENCOUNTER — Encounter: Payer: Self-pay | Admitting: Family Medicine

## 2020-08-29 ENCOUNTER — Other Ambulatory Visit: Payer: Self-pay

## 2020-08-29 MED ORDER — PENNSAID 2 % EX SOLN: 2.0000 g | Freq: Two times a day (BID) | CUTANEOUS | 3 refills | Status: AC

## 2020-09-13 DIAGNOSIS — M79672 Pain in left foot: Secondary | ICD-10-CM | POA: Diagnosis not present

## 2020-09-14 MED FILL — LOSARTAN-HCTZ 50-12.5 MG TA: 50-12.5 | 30 days supply | Qty: 30 | Fill #1

## 2020-09-14 MED FILL — TESTOSTERONE 50 MG/5 GRAM P: 50 MG/5GM | 30 days supply | Qty: 150 | Fill #2

## 2020-09-18 DIAGNOSIS — M79672 Pain in left foot: Secondary | ICD-10-CM | POA: Diagnosis not present

## 2020-09-21 DIAGNOSIS — M79672 Pain in left foot: Secondary | ICD-10-CM | POA: Diagnosis not present

## 2020-10-03 ENCOUNTER — Telehealth: Payer: Self-pay | Admitting: Podiatry

## 2020-10-03 NOTE — Telephone Encounter (Signed)
Pt left 2 messages asking for a call back to r/s appt from 3.25.2022. Also he mentioned he was to be called about charges for the adjustment of the orthotics.  I returned call and left message there will be no charge for the adjustment since it was messed up on our end and to call me back to r/s.

## 2020-10-04 ENCOUNTER — Other Ambulatory Visit: Payer: Self-pay

## 2020-10-04 ENCOUNTER — Ambulatory Visit (INDEPENDENT_AMBULATORY_CARE_PROVIDER_SITE_OTHER): Payer: 59 | Admitting: Podiatrist

## 2020-10-04 DIAGNOSIS — Z978 Presence of other specified devices: Secondary | ICD-10-CM

## 2020-10-04 DIAGNOSIS — D3610 Benign neoplasm of peripheral nerves and autonomic nervous system, unspecified: Secondary | ICD-10-CM

## 2020-10-04 NOTE — Progress Notes (Signed)
Orthotics are too short- they measure to be a 10 and he needs an 11. 5 to fit his current shoes that size.  Will send back to lab to have them adjusted in length only.  Will call when ready for pick up. -  A rush was put on these as he is going to [place] and needs them for the trip.

## 2020-10-05 DIAGNOSIS — M79672 Pain in left foot: Secondary | ICD-10-CM | POA: Diagnosis not present

## 2020-10-06 NOTE — Progress Notes (Signed)
[place] Phone: [phone] Subjective:   Kirk [name], am serving as a scribe for Dr. Hulan Saas. This visit occurred during the SARS-CoV-2 public health emergency.  Safety protocols were in place, including screening questions prior to the visit, additional usage of staff PPE, and extensive cleaning of exam room while observing appropriate contact time as indicated for disinfecting solutions.   I'm seeing this patient by the request  of:  Kirk Bowen, MD  CC: Left foot pain  [id number]   08/28/2020 Breakdown of the transverse arch bilaterally with overpronation of the hindfoot.  Discussed the importance of wearing the custom orthotics he has already over-the-counter orthotics avoiding being barefoot.  We will start with physical therapy.  Worsening pain consider gabapentin consider work-up for ABI.  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  Patient given different treatment options.  Patient has been seen to respond fairly well though to the new custom orthotics.  Discussed with patient about wearing these, other over-the-counter orthotics, proper lacing of the shoes, home exercises to work on the transverse arch and start formal physical therapy.  Worsening symptoms we can consider the possibility of gabapentin or further work-up with ABI and amlodipine.  Patient is in agreement with the plan will follow up again in 6 weeks  Update 10/09/2020 Kirk [name] is a 65 y.o. male coming in with complaint of left foot pain. Patient states that physical therapy is helping but he does feel a pinching in toe.  Patient would state that he is feeling approximately 50% better.  Patient has been working with the physical therapist and stated has been helpful.  Unfortunately does even with sitting in his car or standing in the kitchen for too long he starts getting the numbness and the pain in  his toes.  Is using orthotics and shoes with wider toe box.      Past Medical History:  Diagnosis Date  . Hypertension   . 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 DENTAL SURGERY    . TONSILLECTOMY     Social History   Socioeconomic History  . Marital status: Married    Spouse name: Not on file  . Number of children: Not on file  . Years of education: Not on file  . Highest education level: Not on file  Occupational History  . Not on file  Tobacco Use  . Smoking status: Never Smoker  . Smokeless tobacco: Never Used  Substance and Sexual Activity  . Alcohol use: Yes  . Drug use: [name]  . Sexual activity: Not on file  Other Topics Concern  . Not on file  Social History Narrative  . Not on file   Social Determinants of Health   Financial Resource Strain: Not on file  Food Insecurity: Not on file  Transportation Needs: Not on file  Physical Activity: Not on file  Stress: Not on file  Social Connections: Not on file   [name] Known Allergies Family History  Problem Relation Age of Onset  . Congestive Heart Failure Father   . Heart disease Father   . Parkinson's disease Mother   . Stroke Mother     Current Outpatient Medications (Endocrine & Metabolic):  .  levothyroxine (SYNTHROID, LEVOTHROID) 88 MCG tablet, Take 88 mcg by mouth daily before breakfast. .  predniSONE (DELTASONE) 20 MG tablet, Take 1 tablet (  20 mg total) by mouth daily with breakfast. .  testosterone (ANDROGEL) 50 MG/5GM (1%) GEL, Apply 5 grams onto the skin daily.  Current Outpatient Medications (Cardiovascular):  .  amLODipine (NORVASC) 5 MG tablet, Take 1 tablet (5 mg total) by mouth daily. Marland Kitchen  ezetimibe (ZETIA) 10 MG tablet, Take 10 mg by mouth daily. Marland Kitchen  losartan-hydrochlorothiazide (HYZAAR) 100-25 MG tablet, Take 0.5 tablets by mouth daily. Please make overdue appt with Dr. Tamala Julian before anymore refills. 2nd attempt .  simvastatin (ZOCOR) 40 MG tablet,  Take 40 mg by mouth daily.   Current Outpatient Medications (Analgesics):  .  aspirin EC 81 MG tablet, Take 81 mg by mouth daily. .  Naproxen Sodium 220 MG CAPS, Take 220 mg by mouth 2 (two) times daily as needed (pain).   Current Outpatient Medications (Other):  Marland Kitchen  Diclofenac Sodium (PENNSAID) 2 % SOLN, Place 2 g onto the skin 2 (two) times daily. Marland Kitchen  gabapentin (NEURONTIN) 100 MG capsule, Take 2 capsules (200 mg total) by mouth at bedtime. .  Melatonin 5 MG TABS, Take 5 mg by mouth at bedtime. .  Multiple Vitamins-Minerals (CENTRUM SILVER PO), Take half (1/2) tablet by mouth every other day. .  traZODone (DESYREL) 50 MG tablet, Take 50 mg by mouth at bedtime.   Reviewed prior external information including notes and imaging from  primary care provider As well as notes that were available from care everywhere and other healthcare systems.  Past medical history, social, surgical and family history all reviewed in electronic medical record.  [name] pertanent information unless stated regarding to the chief complaint.   Review of Systems:  [name] headache, visual changes, nausea, vomiting, diarrhea, constipation, dizziness, abdominal pain, skin rash, fevers, chills, night sweats, weight loss, swollen lymph nodes, body aches, joint swelling, chest pain, shortness of breath, mood changes. POSITIVE muscle aches  Objective  Blood pressure 130/84, height 5\' 9"  (1.753 m), weight 192 lb (87.1 kg).   General: [name] apparent distress alert and oriented x3 mood and affect normal, dressed appropriately.  HEENT: Pupils equal, extraocular movements intact  Respiratory: Patient's speak in full sentences and does not appear short of breath  Cardiovascular: [name] lower extremity edema, non tender, [name] erythema  Gait normal with good balance and coordination.  MSK: Left foot exam shows the patient does have breakdown of the transverse arch.  Patient does have a positive squeeze test.  Patient does have mild  overpronation of the hindfoot also noted.  Tenderness between the third and fourth metatarsal heads as well as somewhat over the second and third metatarsal heads.  [name] significant swelling [name] noted.  Neurovascularly intact.  Mild coolness to touch of the toes on the left compared to the right.  Limited musculoskeletal ultrasound was performed and interpreted by Lyndal Pulley  Limited ultrasound shows that between the fourth and fifth metatarsal heads as well as between the third and fourth metatarsal quadrants have some hypoechoic changes that does seem to be somewhat consistent with the neuroma.  This does seem to be felt less than previous exam. Impression: Questionable neuroma noted but possible interval improvement between previous ultrasound.     Impression and Recommendations:     The above documentation has been reviewed and is accurate and complete Lyndal Pulley, DO

## 2020-10-09 ENCOUNTER — Other Ambulatory Visit: Payer: Self-pay

## 2020-10-09 ENCOUNTER — Encounter: Payer: Self-pay | Admitting: Family Medicine

## 2020-10-09 ENCOUNTER — Ambulatory Visit (INDEPENDENT_AMBULATORY_CARE_PROVIDER_SITE_OTHER): Payer: 59 | Admitting: Family Medicine

## 2020-10-09 ENCOUNTER — Ambulatory Visit: Payer: Self-pay

## 2020-10-09 ENCOUNTER — Other Ambulatory Visit: Payer: Self-pay | Admitting: Family Medicine

## 2020-10-09 VITALS — BP 130/84 | Ht 69.0 in | Wt 192.0 lb

## 2020-10-09 DIAGNOSIS — M79672 Pain in left foot: Secondary | ICD-10-CM

## 2020-10-09 DIAGNOSIS — M25562 Pain in left knee: Secondary | ICD-10-CM | POA: Diagnosis not present

## 2020-10-09 DIAGNOSIS — M25561 Pain in right knee: Secondary | ICD-10-CM

## 2020-10-09 MED ORDER — PREDNISONE 20 MG PO TABS: 20.0000 mg | ORAL_TABLET | Freq: Every day | ORAL | 0 refills | Status: DC

## 2020-10-09 MED ORDER — GABAPENTIN 100 MG PO CAPS: 200.0000 mg | ORAL_CAPSULE | Freq: Every day | ORAL | 0 refills | Status: DC

## 2020-10-09 MED FILL — GABAPENTIN 100 MG CAPSULE: 100 | 90 days supply | Qty: 180 | Fill #0

## 2020-10-09 MED FILL — predniSONE 20 MG TABS: 20 | 5 days supply | Qty: 5 | Fill #0

## 2020-10-09 NOTE — Patient Instructions (Signed)
Gabapentin 200mg  at night Prednisone 20mg  starting 3-4 days before trip and take for 5 days Continue everything else we are making progess  Blood flow test:  [place]  ([facility]) [address], [address] [place], [place] [address] [phone]   See me one more time in 6 weeks

## 2020-10-09 NOTE — Assessment & Plan Note (Signed)
    Mild improvement  50% Discussed HEP, start on gabapentin  Will get ABI as well to look at blood flow.  Short course of prednsione for his trip and then If not better in 6 weeks will consider injection or referral for surgery patient knows not to take the prednisone with anti-inflammatories.  We discussed this at great length.

## 2020-10-10 ENCOUNTER — Other Ambulatory Visit (HOSPITAL_BASED_OUTPATIENT_CLINIC_OR_DEPARTMENT_OTHER): Payer: Self-pay

## 2020-10-10 ENCOUNTER — Telehealth: Payer: Self-pay | Admitting: Podiatry

## 2020-10-10 NOTE — Telephone Encounter (Signed)
Pt called checking to see if orthotics were back yet. They were sent out last week and I explained that they are not back yet but I would hope they should be back end of this week or beginning of next week and I would call pt when they come in.

## 2020-10-12 ENCOUNTER — Other Ambulatory Visit (HOSPITAL_COMMUNITY): Payer: Self-pay | Admitting: Endocrinology

## 2020-10-12 MED FILL — LOSARTAN POTASSIUM-HCTZ 50-: 50-12.5 | 30 days supply | Qty: 30 | Fill #2

## 2020-10-12 MED FILL — TESTOSTERONE 50 MG/5 GRAM P: 50 MG/5GM | 30 days supply | Qty: 150 | Fill #0

## 2020-10-13 ENCOUNTER — Other Ambulatory Visit: Payer: Self-pay | Admitting: Family Medicine

## 2020-10-13 ENCOUNTER — Other Ambulatory Visit: Payer: 59

## 2020-10-13 DIAGNOSIS — M25561 Pain in right knee: Secondary | ICD-10-CM

## 2020-10-18 ENCOUNTER — Encounter: Payer: Self-pay | Admitting: Family Medicine

## 2020-10-18 ENCOUNTER — Other Ambulatory Visit: Payer: Self-pay

## 2020-10-18 ENCOUNTER — Ambulatory Visit (HOSPITAL_COMMUNITY)
Admission: RE | Admit: 2020-10-18 | Discharge: 2020-10-18 | Disposition: A | Discharge status: Home / Self Care | Payer: 59 | Source: Ambulatory Visit | Attending: Cardiovascular Disease | Admitting: Cardiovascular Disease

## 2020-10-18 DIAGNOSIS — M25562 Pain in left knee: Secondary | ICD-10-CM | POA: Insufficient documentation

## 2020-10-18 DIAGNOSIS — M25561 Pain in right knee: Secondary | ICD-10-CM | POA: Insufficient documentation

## 2020-10-18 DIAGNOSIS — M79672 Pain in left foot: Secondary | ICD-10-CM | POA: Diagnosis not present

## 2020-11-06 DIAGNOSIS — M79672 Pain in left foot: Secondary | ICD-10-CM | POA: Diagnosis not present

## 2020-11-07 ENCOUNTER — Other Ambulatory Visit (HOSPITAL_COMMUNITY): Payer: Self-pay | Admitting: Endocrinology

## 2020-11-07 ENCOUNTER — Other Ambulatory Visit (HOSPITAL_COMMUNITY): Payer: Self-pay

## 2020-11-07 MED FILL — Simvastatin Tab 40 MG: ORAL | 90 days supply | Qty: 90 | Fill #0 | Status: AC

## 2020-11-07 MED FILL — Ezetimibe Tab 10 MG: ORAL | 90 days supply | Qty: 90 | Fill #0 | Status: AC

## 2020-11-07 MED FILL — Losartan Potassium & Hydrochlorothiazide Tab 50-12.5 MG: ORAL | 90 days supply | Qty: 90 | Fill #0 | Status: AC

## 2020-11-10 DIAGNOSIS — M79672 Pain in left foot: Secondary | ICD-10-CM | POA: Diagnosis not present

## 2020-11-16 ENCOUNTER — Other Ambulatory Visit (HOSPITAL_COMMUNITY): Payer: Self-pay

## 2020-11-16 ENCOUNTER — Other Ambulatory Visit (HOSPITAL_COMMUNITY): Payer: Self-pay | Admitting: Endocrinology

## 2020-11-16 MED ORDER — TESTOSTERONE 50 MG/5GM (1%) TD GEL
TRANSDERMAL | 3 refills | Status: DC
  Filled 2020-11-16 – 2020-12-28 (×4): qty 150, 30d supply, fill #0
  Filled 2021-01-26 (×2): qty 150, 30d supply, fill #1
  Filled 2021-02-20 – 2021-02-22 (×2): qty 150, 30d supply, fill #2
  Filled 2021-03-29: qty 150, 30d supply, fill #3

## 2020-11-16 MED FILL — Testosterone TD Gel 50 MG/5GM (1%): TRANSDERMAL | 30 days supply | Qty: 150 | Fill #0 | Status: AC

## 2020-11-16 NOTE — Progress Notes (Deleted)
[place] Phone: [phone] Subjective:    I'm seeing this patient by the request  of:  [name], MD  CC:   [id number]   10/09/2020  Mild improvement  50% Discussed HEP, start on gabapentin  Will get ABI as well to look at blood flow.  Short course of prednsione for his trip and then If not better in 6 weeks will consider injection or referral for surgery patient knows not to take the prednisone with anti-inflammatories.  We discussed this at great length.  Update 11/21/2020 LAMONTA CYPRESS is a 65 y.o. male coming in with complaint of L foot pain.        Past Medical History:  Diagnosis Date  . Hypertension   . 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)    Past Surgical History:  Procedure Laterality Date  . DENTAL SURGERY    . TONSILLECTOMY     Social History   Socioeconomic History  . Marital status: Married    Spouse name: Not on file  . Number of children: Not on file  . Years of education: Not on file  . Highest education level: Not on file  Occupational History  . Not on file  Tobacco Use  . Smoking status: Never Smoker  . Smokeless tobacco: Never Used  Substance and Sexual Activity  . Alcohol use: Yes  . Drug use: No  . Sexual activity: Not on file  Other Topics Concern  . Not on file  Social History Narrative  . Not on file   Social Determinants of Health   Financial Resource Strain: Not on file  Food Insecurity: Not on file  Transportation Needs: Not on file  Physical Activity: Not on file  Stress: Not on file  Social Connections: Not on file   No Known Allergies Family History  Problem Relation Age of Onset  . Congestive Heart Failure Father   . Heart disease Father   . Parkinson's disease Mother   . Stroke Mother     Current Outpatient Medications (Endocrine & Metabolic):  .  levothyroxine (SYNTHROID) 88 MCG tablet, TAKE 1 TABLET BY MOUTH ONCE DAILY AND 1 EXTRA  TABLET ON SUNDAYS .  levothyroxine (SYNTHROID) 88 MCG tablet, TAKE 1 TABLET BY MOUTH ONCE DAILY AND 1 EXTRA TABLET ON SUNDAYS .  levothyroxine (SYNTHROID, LEVOTHROID) 88 MCG tablet, Take 88 mcg by mouth daily before breakfast. .  predniSONE (DELTASONE) 20 MG tablet, TAKE 1 TABLET BY MOUTH ONCE A DAY WITH BREAKFAST .  testosterone (ANDROGEL) 50 MG/5GM (1%) GEL, Apply 5 grams onto the skin daily. Marland Kitchen  testosterone (ANDROGEL) 50 MG/5GM (1%) GEL, APPLY 1 PACKET TO SKIN ONCE DAILY AS DIRECTED .  testosterone (ANDROGEL) 50 MG/5GM (1%) GEL, APPLY 1 PACKET TO SKIN ONCE DAILY AS DIRECTED  Current Outpatient Medications (Cardiovascular):  .  amLODipine (NORVASC) 5 MG tablet, Take 1 tablet (5 mg total) by mouth daily. Marland Kitchen  ezetimibe (ZETIA) 10 MG tablet, Take 10 mg by mouth daily. Marland Kitchen  ezetimibe (ZETIA) 10 MG tablet, TAKE 1 TABLET BY MOUTH ONCE DAILY .  losartan-hydrochlorothiazide (HYZAAR) 100-25 MG tablet, Take 0.5 tablets by mouth daily. Please make overdue appt with Dr. Tamala Julian before anymore refills. 2nd attempt .  losartan-hydrochlorothiazide (HYZAAR) 50-12.5 MG tablet, TAKE 1 TABLET BY MOUTH DAILY .  losartan-hydrochlorothiazide (HYZAAR) 50-12.5 MG tablet, TAKE 1 TABLET BY MOUTH DAILY .  simvastatin (ZOCOR) 40 MG tablet, Take 40 mg by mouth daily. .  simvastatin (  ZOCOR) 40 MG tablet, TAKE 1 TABLET BY MOUTH ONCE DAILY   Current Outpatient Medications (Analgesics):  .  aspirin EC 81 MG tablet, Take 81 mg by mouth daily. .  Naproxen Sodium 220 MG CAPS, Take 220 mg by mouth 2 (two) times daily as needed (pain).   Current Outpatient Medications (Other):  Marland Kitchen  Diclofenac Sodium (PENNSAID) 2 % SOLN, Place 2 g onto the skin 2 (two) times daily. Marland Kitchen  gabapentin (NEURONTIN) 100 MG capsule, TAKE 2 CAPSULES BY MOUTH AT BEDTIME .  influenza vac split quadrivalent PF (FLUARIX) 0.5 ML injection, INJECT AS DIRECTED .  Melatonin 5 MG TABS, Take 5 mg by mouth at bedtime. .  Multiple Vitamins-Minerals (CENTRUM SILVER PO),  Take half (1/2) tablet by mouth every other day. .  traZODone (DESYREL) 50 MG tablet, Take 50 mg by mouth at bedtime. .  traZODone (DESYREL) 50 MG tablet, TAKE 1 TABLET BY MOUTH EACH NIGHT AT BEDTIME AS NEEDED   Reviewed prior external information including notes and imaging from  primary care provider As well as notes that were available from care everywhere and other healthcare systems.  Past medical history, social, surgical and family history all reviewed in electronic medical record.  No pertanent information unless stated regarding to the chief complaint.   Review of Systems:  No headache, visual changes, nausea, vomiting, diarrhea, constipation, dizziness, abdominal pain, skin rash, fevers, chills, night sweats, weight loss, swollen lymph nodes, body aches, joint swelling, chest pain, shortness of breath, mood changes. POSITIVE muscle aches  Objective  There were no vitals taken for this visit.   General: No apparent distress alert and oriented x3 mood and affect normal, dressed appropriately.  HEENT: Pupils equal, extraocular movements intact  Respiratory: Patient's speak in full sentences and does not appear short of breath  Cardiovascular: No lower extremity edema, non tender, no erythema  Gait normal with good balance and coordination.  MSK:  Non tender with full range of motion and good stability and symmetric strength and tone of shoulders, elbows, wrist, hip, knee and ankles bilaterally.     Impression and Recommendations:     The above documentation has been reviewed and is accurate and complete Jacqualin Combes

## 2020-11-17 ENCOUNTER — Other Ambulatory Visit (HOSPITAL_COMMUNITY): Payer: Self-pay

## 2020-11-18 ENCOUNTER — Other Ambulatory Visit (HOSPITAL_COMMUNITY): Payer: Self-pay

## 2020-11-18 ENCOUNTER — Other Ambulatory Visit (HOSPITAL_COMMUNITY): Payer: Self-pay | Admitting: Endocrinology

## 2020-11-19 MED ORDER — LEVOTHYROXINE SODIUM 88 MCG PO TABS
ORAL_TABLET | ORAL | 0 refills | Status: DC
  Filled 2020-11-19: qty 96, 84d supply, fill #0

## 2020-11-19 MED ORDER — TRAZODONE HCL 50 MG PO TABS
ORAL_TABLET | ORAL | 1 refills | Status: DC
  Filled 2020-11-19: qty 90, 90d supply, fill #0
  Filled 2021-02-12: qty 90, 90d supply, fill #1

## 2020-11-20 ENCOUNTER — Other Ambulatory Visit (HOSPITAL_COMMUNITY): Payer: Self-pay

## 2020-11-20 DIAGNOSIS — H5203 Hypermetropia, bilateral: Secondary | ICD-10-CM | POA: Diagnosis not present

## 2020-11-21 ENCOUNTER — Ambulatory Visit: Payer: 59 | Admitting: Family Medicine

## 2020-12-04 DIAGNOSIS — E785 Hyperlipidemia, unspecified: Secondary | ICD-10-CM | POA: Diagnosis not present

## 2020-12-04 DIAGNOSIS — I7300 Raynaud's syndrome without gangrene: Secondary | ICD-10-CM | POA: Diagnosis not present

## 2020-12-04 DIAGNOSIS — E559 Vitamin D deficiency, unspecified: Secondary | ICD-10-CM | POA: Diagnosis not present

## 2020-12-04 DIAGNOSIS — D751 Secondary polycythemia: Secondary | ICD-10-CM | POA: Diagnosis not present

## 2020-12-04 DIAGNOSIS — R7301 Impaired fasting glucose: Secondary | ICD-10-CM | POA: Diagnosis not present

## 2020-12-04 DIAGNOSIS — E039 Hypothyroidism, unspecified: Secondary | ICD-10-CM | POA: Diagnosis not present

## 2020-12-04 DIAGNOSIS — E291 Testicular hypofunction: Secondary | ICD-10-CM | POA: Diagnosis not present

## 2020-12-04 DIAGNOSIS — E236 Other disorders of pituitary gland: Secondary | ICD-10-CM | POA: Diagnosis not present

## 2020-12-04 DIAGNOSIS — F5221 Male erectile disorder: Secondary | ICD-10-CM | POA: Diagnosis not present

## 2020-12-06 DIAGNOSIS — M79672 Pain in left foot: Secondary | ICD-10-CM | POA: Diagnosis not present

## 2020-12-27 ENCOUNTER — Ambulatory Visit: Payer: 59 | Admitting: Family Medicine

## 2020-12-27 NOTE — Progress Notes (Signed)
[place] Phone: [phone] Subjective:   Fontaine No, am serving as a scribe for Dr. Hulan Saas. This visit occurred during the SARS-CoV-2 public health emergency.  Safety protocols were in place, including screening questions prior to the visit, additional usage of staff PPE, and extensive cleaning of exam room while observing appropriate contact time as indicated for disinfecting solutions.   I'm seeing this patient by the request  of:  [name], MD  CC: Foot pain follow-up  [id number]   10/09/2020 Mild improvement  50% Discussed HEP, start on gabapentin  Will get ABI as well to look at blood flow.  Short course of prednsione for his trip and then If not better in 6 weeks will consider injection or referral for surgery patient knows not to take the prednisone with anti-inflammatories.  We discussed this at great length.  Update 12/28/2020 CRESENCIO REESOR is a 65 y.o. male coming in with complaint of B foot pain, L>R. Patient states that he has improved since last visit. Has been using [place]'s which are more comfortable than his running shoes. Notes that straps of sandals rub on great toe. Wonders if this is a superficial issue or an issue with "lubrication" of the tendon. Patient feels that he has to force himself to walk pigeon-toed in order to prevent supination of both of his feet. Has appointment for adjustment for orthotics tomorrow.   Dentist is wondering if gabapentin is cause of his mandibular gum swelling.        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[facility])    Past Surgical History:  Procedure Laterality Date   DENTAL SURGERY     TONSILLECTOMY     Social History   Socioeconomic History   Marital status: Married    Spouse name: Not on file   Number of children: Not on file   Years of education: Not on file   Highest education level:  Not on file  Occupational History   Not on file  Tobacco Use   Smoking status: Never   Smokeless tobacco: Never  Substance and Sexual Activity   Alcohol use: Yes   Drug use: No   Sexual activity: Not on file  Other Topics Concern   Not on file  Social History Narrative   Not on file   Social Determinants of Health   Financial Resource Strain: Not on file  Food Insecurity: Not on file  Transportation Needs: Not on file  Physical Activity: Not on file  Stress: Not on file  Social Connections: Not on file   No Known Allergies Family History  Problem Relation Age of Onset   Congestive Heart Failure Father    Heart disease Father    Parkinson's disease Mother    Stroke Mother     Current Outpatient Medications (Endocrine & Metabolic):    levothyroxine (SYNTHROID) 88 MCG tablet, TAKE 1 TABLET BY MOUTH ONCE DAILY AND 1 EXTRA TABLET ON SUNDAYS   levothyroxine (SYNTHROID) 88 MCG tablet, TAKE 1 TABLET BY MOUTH ONCE DAILY AND 1 EXTRA TABLET ON SUNDAYS   levothyroxine (SYNTHROID, LEVOTHROID) 88 MCG tablet, Take 88 mcg by mouth daily before breakfast.   predniSONE (DELTASONE) 20 MG tablet, TAKE 1 TABLET BY MOUTH ONCE A DAY WITH BREAKFAST   testosterone (ANDROGEL) 50 MG/5GM (1%) GEL, Apply 5 grams onto the skin daily.   testosterone (ANDROGEL) 50 MG/5GM (1%)  GEL, APPLY 1 PACKET TO SKIN ONCE DAILY AS DIRECTED   testosterone (ANDROGEL) 50 MG/5GM (1%) GEL, APPLY 1 PACKET TO SKIN ONCE DAILY AS DIRECTED   testosterone (ANDROGEL) 50 MG/5GM (1%) GEL, APPLY 1 PACKET TO SKIN ONCE DAILY AS DIRECTED  Current Outpatient Medications (Cardiovascular):    amLODipine (NORVASC) 5 MG tablet, Take 1 tablet (5 mg total) by mouth daily.   ezetimibe (ZETIA) 10 MG tablet, Take 10 mg by mouth daily.   ezetimibe (ZETIA) 10 MG tablet, TAKE 1 TABLET BY MOUTH ONCE DAILY   losartan-hydrochlorothiazide (HYZAAR) 100-25 MG tablet, Take 0.5 tablets by mouth daily. Please make overdue appt with Dr. Tamala Julian before  anymore refills. 2nd attempt   losartan-hydrochlorothiazide (HYZAAR) 50-12.5 MG tablet, TAKE 1 TABLET BY MOUTH DAILY   simvastatin (ZOCOR) 40 MG tablet, Take 40 mg by mouth daily.   simvastatin (ZOCOR) 40 MG tablet, TAKE 1 TABLET BY MOUTH ONCE DAILY   losartan-hydrochlorothiazide (HYZAAR) 50-12.5 MG tablet, TAKE 1 TABLET BY MOUTH DAILY   Current Outpatient Medications (Analgesics):    aspirin EC 81 MG tablet, Take 81 mg by mouth daily.   Naproxen Sodium 220 MG CAPS, Take 220 mg by mouth 2 (two) times daily as needed (pain).   Current Outpatient Medications (Other):    Diclofenac Sodium (PENNSAID) 2 % SOLN, Place 2 g onto the skin 2 (two) times daily.   gabapentin (NEURONTIN) 100 MG capsule, TAKE 2 CAPSULES BY MOUTH AT BEDTIME   influenza vac split quadrivalent PF (FLUARIX) 0.5 ML injection, INJECT AS DIRECTED   Melatonin 5 MG TABS, Take 5 mg by mouth at bedtime.   Multiple Vitamins-Minerals (CENTRUM SILVER PO), Take half (1/2) tablet by mouth every other day.   traZODone (DESYREL) 50 MG tablet, Take 50 mg by mouth at bedtime.   traZODone (DESYREL) 50 MG tablet, TAKE 1 TABLET BY MOUTH EACH NIGHT AT BEDTIME AS NEEDED   Reviewed prior external information including notes and imaging from  primary care provider As well as notes that were available from care everywhere and other healthcare systems.  Past medical history, social, surgical and family history all reviewed in electronic medical record.  No pertanent information unless stated regarding to the chief complaint.   Review of Systems:  No headache, visual changes, nausea, vomiting, diarrhea, constipation, dizziness, abdominal pain, skin rash, fevers, chills, night sweats, weight loss, swollen lymph nodes, body aches, joint swelling, chest pain, shortness of breath, mood changes.   Objective  Blood pressure 118/76, pulse 79, height 5\' 9"  (1.753 m), weight 186 lb (84.4 kg), SpO2 97 %.   General: No apparent distress alert and  oriented x3 mood and affect normal, dressed appropriately.  HEENT: Pupils equal, extraocular movements intact  Respiratory: Patient's speak in full sentences and does not appear short of breath  Cardiovascular: No lower extremity edema, non tender, no erythema  Gait normal with good balance and coordination.  MSK: Left foot exam still shows the patient does have a breakdown of the transverse arch noted.  Positive squeeze test noted.  Pain seems to be between the fourth and fifth as well as the third and fourth intermetatarsal spaces.  Possibly even some mild swelling noted.  Patient's ankle negative Tinel's but mild stiffness noted.  Neurovascularly intact distally.   Impression and Recommendations:     The above documentation has been reviewed and is accurate and complete Lyndal Pulley, DO

## 2020-12-28 ENCOUNTER — Encounter: Payer: Self-pay | Admitting: Family Medicine

## 2020-12-28 ENCOUNTER — Other Ambulatory Visit (HOSPITAL_COMMUNITY): Payer: Self-pay

## 2020-12-28 ENCOUNTER — Ambulatory Visit: Payer: Self-pay

## 2020-12-28 ENCOUNTER — Ambulatory Visit (INDEPENDENT_AMBULATORY_CARE_PROVIDER_SITE_OTHER): Payer: 59 | Admitting: Family Medicine

## 2020-12-28 ENCOUNTER — Other Ambulatory Visit: Payer: Self-pay

## 2020-12-28 VITALS — BP 118/76 | HR 79 | Ht 69.0 in | Wt 186.0 lb

## 2020-12-28 DIAGNOSIS — M79672 Pain in left foot: Secondary | ICD-10-CM | POA: Diagnosis not present

## 2020-12-28 DIAGNOSIS — G5762 Lesion of plantar nerve, left lower limb: Secondary | ICD-10-CM

## 2020-12-28 DIAGNOSIS — G5782 Other specified mononeuropathies of left lower limb: Secondary | ICD-10-CM

## 2020-12-28 NOTE — Patient Instructions (Signed)
Kirk [name] [employer] [address] [facility] [phone] Continue everything else We will be in touch with results

## 2020-12-28 NOTE — Assessment & Plan Note (Signed)
Patient has what appears to be more of a neuroma this entire time but continues to have difficulty.  Patient has failed conservative therapy including formal physical therapy, injections from outside facility, home exercises, custom orthotics, and gabapentin.  Patient unfortunately is still having pain enough to keep him from even certain daily activities.  I do believe at this time advanced imaging of the MRI of the foot and ankle would be beneficial.  This would allow [place] to know if any surgical intervention is necessary.  Patient is in agreement with the plan.  We will follow-up after imaging and discuss further.

## 2020-12-29 ENCOUNTER — Other Ambulatory Visit: Payer: 59

## 2020-12-29 ENCOUNTER — Other Ambulatory Visit (HOSPITAL_COMMUNITY): Payer: Self-pay

## 2021-01-01 ENCOUNTER — Encounter: Payer: Self-pay | Admitting: Family Medicine

## 2021-01-01 ENCOUNTER — Other Ambulatory Visit: Payer: Self-pay

## 2021-01-01 ENCOUNTER — Other Ambulatory Visit (HOSPITAL_COMMUNITY): Payer: Self-pay

## 2021-01-01 MED ORDER — GABAPENTIN 100 MG PO CAPS
ORAL_CAPSULE | Freq: Every day | ORAL | 0 refills | Status: AC
  Filled 2021-01-01: qty 30, 15d supply, fill #0

## 2021-01-02 ENCOUNTER — Other Ambulatory Visit (HOSPITAL_COMMUNITY): Payer: Self-pay

## 2021-01-26 ENCOUNTER — Other Ambulatory Visit (HOSPITAL_COMMUNITY): Payer: Self-pay

## 2021-01-31 ENCOUNTER — Other Ambulatory Visit (HOSPITAL_COMMUNITY): Payer: Self-pay

## 2021-01-31 MED FILL — Losartan Potassium & Hydrochlorothiazide Tab 50-12.5 MG: ORAL | 90 days supply | Qty: 90 | Fill #1 | Status: AC

## 2021-02-01 ENCOUNTER — Other Ambulatory Visit (HOSPITAL_COMMUNITY): Payer: Self-pay

## 2021-02-01 MED ORDER — EZETIMIBE 10 MG PO TABS
10.0000 mg | ORAL_TABLET | Freq: Every day | ORAL | 2 refills | Status: AC
  Filled 2021-02-01: qty 90, 90d supply, fill #0
  Filled 2021-04-26: qty 90, 90d supply, fill #1
  Filled 2021-10-09: qty 90, 90d supply, fill #2

## 2021-02-01 MED ORDER — SIMVASTATIN 40 MG PO TABS
40.0000 mg | ORAL_TABLET | Freq: Every day | ORAL | 2 refills | Status: DC
  Filled 2021-02-01: qty 90, 90d supply, fill #0
  Filled 2021-04-26: qty 90, 90d supply, fill #1
  Filled 2021-07-17: qty 90, 90d supply, fill #2

## 2021-02-01 MED ORDER — EZETIMIBE 10 MG PO TABS
10.0000 mg | ORAL_TABLET | Freq: Every day | ORAL | 2 refills | Status: DC
  Filled 2021-07-17: qty 90, 90d supply, fill #0
  Filled 2021-12-31: qty 90, 90d supply, fill #1

## 2021-02-12 ENCOUNTER — Other Ambulatory Visit (HOSPITAL_COMMUNITY): Payer: Self-pay

## 2021-02-12 MED ORDER — LEVOTHYROXINE SODIUM 88 MCG PO TABS
ORAL_TABLET | ORAL | 2 refills | Status: DC
  Filled 2021-02-12: qty 102, 89d supply, fill #0
  Filled 2021-05-08: qty 102, 89d supply, fill #1
  Filled 2021-07-30: qty 102, 89d supply, fill #2

## 2021-02-20 ENCOUNTER — Other Ambulatory Visit (HOSPITAL_COMMUNITY): Payer: Self-pay

## 2021-02-22 ENCOUNTER — Other Ambulatory Visit (HOSPITAL_COMMUNITY): Payer: Self-pay

## 2021-02-23 ENCOUNTER — Other Ambulatory Visit (HOSPITAL_COMMUNITY): Payer: Self-pay

## 2021-03-12 ENCOUNTER — Ambulatory Visit
Admission: RE | Admit: 2021-03-12 | Discharge: 2021-03-12 | Disposition: A | Discharge status: Home / Self Care | Payer: 59 | Source: Ambulatory Visit | Attending: Family Medicine | Admitting: Family Medicine

## 2021-03-12 ENCOUNTER — Other Ambulatory Visit: Payer: Self-pay

## 2021-03-12 DIAGNOSIS — M19072 Primary osteoarthritis, left ankle and foot: Secondary | ICD-10-CM | POA: Diagnosis not present

## 2021-03-12 DIAGNOSIS — M79672 Pain in left foot: Secondary | ICD-10-CM

## 2021-03-12 DIAGNOSIS — M7989 Other specified soft tissue disorders: Secondary | ICD-10-CM | POA: Diagnosis not present

## 2021-03-19 ENCOUNTER — Other Ambulatory Visit (HOSPITAL_COMMUNITY): Payer: Self-pay

## 2021-03-29 ENCOUNTER — Other Ambulatory Visit (HOSPITAL_COMMUNITY): Payer: Self-pay

## 2021-03-30 ENCOUNTER — Other Ambulatory Visit (HOSPITAL_COMMUNITY): Payer: Self-pay

## 2021-04-26 ENCOUNTER — Other Ambulatory Visit (HOSPITAL_COMMUNITY): Payer: Self-pay

## 2021-04-26 MED ORDER — TESTOSTERONE 50 MG/5GM (1%) TD GEL
TRANSDERMAL | 3 refills | Status: AC
  Filled 2021-04-26: qty 150, 30d supply, fill #0
  Filled 2021-05-17 – 2021-05-24 (×2): qty 150, 30d supply, fill #1

## 2021-04-26 MED ORDER — LOSARTAN POTASSIUM-HCTZ 50-12.5 MG PO TABS
ORAL_TABLET | ORAL | 3 refills | Status: DC
  Filled 2021-04-26: qty 90, 90d supply, fill #0
  Filled 2021-07-17: qty 90, 90d supply, fill #1
  Filled 2021-10-09 – 2021-10-13 (×2): qty 90, 90d supply, fill #2
  Filled 2022-01-29: qty 90, 90d supply, fill #3
  Filled ????-??-??: fill #2

## 2021-04-26 MED ORDER — TESTOSTERONE 50 MG/5GM (1%) TD GEL
TRANSDERMAL | 3 refills | Status: AC
  Filled 2021-07-02: qty 150, 30d supply, fill #0
  Filled 2021-07-30: qty 150, 30d supply, fill #1

## 2021-04-27 ENCOUNTER — Other Ambulatory Visit (HOSPITAL_COMMUNITY): Payer: Self-pay

## 2021-05-08 ENCOUNTER — Other Ambulatory Visit (HOSPITAL_COMMUNITY): Payer: Self-pay

## 2021-05-08 MED ORDER — TRAZODONE HCL 50 MG PO TABS
ORAL_TABLET | ORAL | 3 refills | Status: DC
  Filled 2021-05-08: qty 90, 90d supply, fill #0
  Filled 2021-07-30: qty 90, 90d supply, fill #1
  Filled 2021-10-26: qty 90, 90d supply, fill #2
  Filled 2022-01-17 – 2022-01-28 (×2): qty 90, 90d supply, fill #3

## 2021-05-11 ENCOUNTER — Other Ambulatory Visit (HOSPITAL_COMMUNITY): Payer: Self-pay

## 2021-05-17 ENCOUNTER — Other Ambulatory Visit (HOSPITAL_COMMUNITY): Payer: Self-pay

## 2021-05-21 ENCOUNTER — Other Ambulatory Visit (HOSPITAL_COMMUNITY): Payer: Self-pay

## 2021-05-21 DIAGNOSIS — R208 Other disturbances of skin sensation: Secondary | ICD-10-CM | POA: Diagnosis not present

## 2021-05-21 DIAGNOSIS — D1801 Hemangioma of skin and subcutaneous tissue: Secondary | ICD-10-CM | POA: Diagnosis not present

## 2021-05-21 DIAGNOSIS — D2271 Melanocytic nevi of right lower limb, including hip: Secondary | ICD-10-CM | POA: Diagnosis not present

## 2021-05-21 DIAGNOSIS — L570 Actinic keratosis: Secondary | ICD-10-CM | POA: Diagnosis not present

## 2021-05-21 DIAGNOSIS — D225 Melanocytic nevi of trunk: Secondary | ICD-10-CM | POA: Diagnosis not present

## 2021-05-24 ENCOUNTER — Other Ambulatory Visit (HOSPITAL_COMMUNITY): Payer: Self-pay

## 2021-05-25 ENCOUNTER — Other Ambulatory Visit (HOSPITAL_COMMUNITY): Payer: Self-pay

## 2021-06-06 DIAGNOSIS — R7301 Impaired fasting glucose: Secondary | ICD-10-CM | POA: Diagnosis not present

## 2021-06-06 DIAGNOSIS — E785 Hyperlipidemia, unspecified: Secondary | ICD-10-CM | POA: Diagnosis not present

## 2021-06-06 DIAGNOSIS — Z125 Encounter for screening for malignant neoplasm of prostate: Secondary | ICD-10-CM | POA: Diagnosis not present

## 2021-06-06 DIAGNOSIS — E559 Vitamin D deficiency, unspecified: Secondary | ICD-10-CM | POA: Diagnosis not present

## 2021-06-06 DIAGNOSIS — E039 Hypothyroidism, unspecified: Secondary | ICD-10-CM | POA: Diagnosis not present

## 2021-06-07 ENCOUNTER — Other Ambulatory Visit (HOSPITAL_COMMUNITY): Payer: Self-pay

## 2021-06-11 DIAGNOSIS — I7300 Raynaud's syndrome without gangrene: Secondary | ICD-10-CM | POA: Diagnosis not present

## 2021-06-11 DIAGNOSIS — R7301 Impaired fasting glucose: Secondary | ICD-10-CM | POA: Diagnosis not present

## 2021-06-11 DIAGNOSIS — I5189 Other ill-defined heart diseases: Secondary | ICD-10-CM | POA: Diagnosis not present

## 2021-06-11 DIAGNOSIS — E236 Other disorders of pituitary gland: Secondary | ICD-10-CM | POA: Diagnosis not present

## 2021-06-11 DIAGNOSIS — R82998 Other abnormal findings in urine: Secondary | ICD-10-CM | POA: Diagnosis not present

## 2021-06-11 DIAGNOSIS — E039 Hypothyroidism, unspecified: Secondary | ICD-10-CM | POA: Diagnosis not present

## 2021-06-11 DIAGNOSIS — Z0000 Encounter for general adult medical examination without abnormal findings: Secondary | ICD-10-CM | POA: Diagnosis not present

## 2021-06-11 DIAGNOSIS — R251 Tremor, unspecified: Secondary | ICD-10-CM | POA: Diagnosis not present

## 2021-06-11 DIAGNOSIS — Z1339 Encounter for screening examination for other mental health and behavioral disorders: Secondary | ICD-10-CM | POA: Diagnosis not present

## 2021-06-11 DIAGNOSIS — Z1331 Encounter for screening for depression: Secondary | ICD-10-CM | POA: Diagnosis not present

## 2021-06-11 DIAGNOSIS — E785 Hyperlipidemia, unspecified: Secondary | ICD-10-CM | POA: Diagnosis not present

## 2021-06-11 DIAGNOSIS — Z1212 Encounter for screening for malignant neoplasm of rectum: Secondary | ICD-10-CM | POA: Diagnosis not present

## 2021-07-02 ENCOUNTER — Other Ambulatory Visit (HOSPITAL_COMMUNITY): Payer: Self-pay

## 2021-07-03 ENCOUNTER — Other Ambulatory Visit (HOSPITAL_COMMUNITY): Payer: Self-pay

## 2021-07-04 ENCOUNTER — Other Ambulatory Visit (HOSPITAL_COMMUNITY): Payer: Self-pay

## 2021-07-17 ENCOUNTER — Other Ambulatory Visit (HOSPITAL_COMMUNITY): Payer: Self-pay

## 2021-07-17 NOTE — Progress Notes (Signed)
[place] Phone: [phone] Subjective:   Fontaine No, am serving as a scribe for Dr. Hulan Saas. This visit occurred during the SARS-CoV-2 public health emergency.  Safety protocols were in place, including screening questions prior to the visit, additional usage of staff PPE, and extensive cleaning of exam room while observing appropriate contact time as indicated for disinfecting solutions.  I'm seeing this patient by the request  of:  [name], MD  CC: foot pain follow up   [id number]  12/28/2020 Patient has what appears to be more of a neuroma this entire time but continues to have difficulty.  Patient has failed conservative therapy including formal physical therapy, injections from outside facility, home exercises, custom orthotics, and gabapentin.  Patient unfortunately is still having pain enough to keep him from even certain daily activities.  I do believe at this time advanced imaging of the MRI of the foot and ankle would be beneficial.  This would allow [place] to know if any surgical intervention is necessary.  Patient is in agreement with the plan.  We will follow-up after imaging and discuss further.  Updates 07/18/2021 LINN GOETZE is a 65 y.o. male coming in with complaint of foot pain.  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  Patient did have an MRI of the ankle and the foot.  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 No change in pain since last visit. Is able to do step machine without pain. When sitting down he will feel pain over the lateral aspect of 5th toe and over 3rd metatarsal. Standing also increases his pain. Patient has tried to do PT exercises while driving.    Also complaining of chronic L shoulder pain in L trap. Pain with bicep curls in the tricep. Patient works on stretching and posture. Denies any radiating symptoms. 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[facility])    Past Surgical History:  Procedure Laterality Date   DENTAL SURGERY     TONSILLECTOMY     Social History   Socioeconomic History   Marital status: Married    Spouse name: Not on file   Number of children: Not on file   Years of education: Not on file   Highest education level: Not on file  Occupational History   Not on file  Tobacco Use   Smoking status: Never   Smokeless tobacco: Never  Substance and Sexual Activity   Alcohol use: Yes   Drug use: No   Sexual activity: Not on file  Other Topics Concern   Not on file  Social History Narrative   Not on file   Social Determinants of Health   Financial Resource Strain: Not on file  Food Insecurity: Not on file  Transportation Needs: Not on file  Physical Activity: Not on file  Stress: Not on file  Social Connections: Not on file   No Known Allergies Family History  Problem Relation Age of Onset   Congestive Heart Failure Father    Heart disease Father    Parkinson's disease Mother  Stroke Mother     Current Outpatient Medications (Endocrine & Metabolic):    levothyroxine (SYNTHROID) 88 MCG tablet, TAKE 1 TABLET BY MOUTH ONCE DAILY AND TAKE 1 EXTRA TABLET ON SUNDAYS   levothyroxine (SYNTHROID, LEVOTHROID) 88 MCG tablet, Take 88 mcg by mouth daily before breakfast.   predniSONE (DELTASONE) 20 MG tablet, TAKE 1 TABLET BY MOUTH ONCE A DAY WITH BREAKFAST   testosterone (ANDROGEL) 50 MG/5GM (1%) GEL, Apply 5 grams onto the skin daily.   testosterone (ANDROGEL) 50 MG/5GM (1%) GEL, APPLY 1 PACKET TO SKIN ONCE DAILY AS DIRECTED   testosterone (ANDROGEL) 50 MG/5GM (1%) GEL, APPLY 1 PACKET TO SKIN ONCE DAILY AS  DIRECTED   levothyroxine (SYNTHROID) 88 MCG tablet, TAKE 1 TABLET BY MOUTH ONCE DAILY AND 1 EXTRA TABLET ON SUNDAYS   testosterone (ANDROGEL) 50 MG/5GM (1%) GEL, APPLY 1 PACKET TO SKIN ONCE DAILY AS DIRECTED   testosterone (ANDROGEL) 50 MG/5GM (1%) GEL, APPLY 1 PACKET TO SKIN ONCE DAILY AS DIRECTED  Current Outpatient Medications (Cardiovascular):    amLODipine (NORVASC) 5 MG tablet, Take 1 tablet (5 mg total) by mouth daily.   ezetimibe (ZETIA) 10 MG tablet, Take 10 mg by mouth daily.   ezetimibe (ZETIA) 10 MG tablet, TAKE 1 TABLET BY MOUTH ONCE DAILY   ezetimibe (ZETIA) 10 MG tablet, TAKE 1 TABLET BY MOUTH ONCE DAILY   losartan-hydrochlorothiazide (HYZAAR) 100-25 MG tablet, Take 0.5 tablets by mouth daily. Please make overdue appt with Dr. Tamala Julian before anymore refills. 2nd attempt   losartan-hydrochlorothiazide (HYZAAR) 50-12.5 MG tablet, TAKE 1 TABLET BY MOUTH DAILY   losartan-hydrochlorothiazide (HYZAAR) 50-12.5 MG tablet, TAKE 1 TABLET BY MOUTH DAILY   simvastatin (ZOCOR) 40 MG tablet, Take 40 mg by mouth daily.   simvastatin (ZOCOR) 40 MG tablet, TAKE 1 TABLET BY MOUTH ONCE DAILY   losartan-hydrochlorothiazide (HYZAAR) 50-12.5 MG tablet, TAKE 1 TABLET BY MOUTH DAILY   Current Outpatient Medications (Analgesics):    aspirin EC 81 MG tablet, Take 81 mg by mouth daily.   Naproxen Sodium 220 MG CAPS, Take 220 mg by mouth 2 (two) times daily as needed (pain).   Current Outpatient Medications (Other):    Diclofenac Sodium (PENNSAID) 2 % SOLN, Place 2 g onto the skin 2 (two) times daily.   gabapentin (NEURONTIN) 100 MG capsule, TAKE 2 CAPSULES BY MOUTH AT BEDTIME   Melatonin 5 MG TABS, Take 5 mg by mouth at bedtime.   Multiple Vitamins-Minerals (CENTRUM SILVER PO), Take half (1/2) tablet by mouth every other day.   traZODone (DESYREL) 50 MG tablet, Take 50 mg by mouth at bedtime.   traZODone (DESYREL) 50 MG tablet, TAKE 1 TABLET BY MOUTH EACH NIGHT AT BEDTIME AS NEEDED   Reviewed prior  external information including notes and imaging from  primary care provider As well as notes that were available from care everywhere and other healthcare systems.  Past medical history, social, surgical and family history all reviewed in electronic medical record.  No pertanent information unless stated regarding to the chief complaint.   Review of Systems:  No headache, visual changes, nausea, vomiting, diarrhea, constipation, dizziness, abdominal pain, skin rash, fevers, chills, night sweats, weight loss, swollen lymph nodes, body aches, joint swelling, chest pain, shortness of breath, mood changes. POSITIVE muscle aches  Objective  Blood pressure 122/86, pulse 68, height 5\' 9"  (1.753 m), weight 191 lb (86.6 kg), SpO2 97 %.   General: No apparent distress alert and oriented x3 mood and affect normal, dressed appropriately.  HEENT: Pupils equal, extraocular movements intact  Respiratory: Patient's speak in full sentences and does not appear short of breath  Cardiovascular: No lower extremity edema, non tender, no erythema  Gait normal with good balance and coordination.  MSK: Patient foot exam does show some breakdown of the transverse arch noted.  Patient does still have a positive squeeze test .  Pain in the third intermetatarsal space noted.  No pain over the bones themselves.  Neurovascularly intact distally.  Right upper shoulder shows the patient does have some tightness of the trapezius but more actually tightness noted on the left side.  Patient is more tender on the left side.  Mild limitation in range of motion of the neck.   Procedure: Real-time Ultrasound Guided Injection of left third intermetatarsal space Device: GE Logiq Q7 Ultrasound guided injection is preferred based studies that show increased duration, increased effect, greater accuracy, decreased procedural pain, increased response rate, and decreased cost with ultrasound guided versus blind injection.  Verbal  informed consent obtained.  Time-out conducted.  Noted no overlying erythema, induration, or other signs of local infection.  Skin prepped in a sterile fashion.  Local anesthesia: Topical Ethyl chloride.  With sterile technique and under real time ultrasound guidance: With a 25-gauge half inch needle injecting 0.5 cc of 0.5% Marcaine and 0.5 cc of Kenalog 40 mg/mL Completed without difficulty  Pain immediately resolved suggesting accurate placement of the medication.  Advised to call if fevers/chills, erythema, induration, drainage, or persistent bleeding.  Impression: Technically successful ultrasound guided injection.  [id number]; 15 additional minutes spent for Therapeutic exercises as stated in above notes.  This included exercises focusing on stretching, strengthening, with significant focus on eccentric aspects.   Long term goals include an improvement in range of motion, strength, endurance as well as avoiding reinjury. Patient's frequency would include in 1-2 times a day, 3-5 times a week for a duration of 6-12 weeks. Shoulder Exercises that included:  Basic scapular stabilization to include adduction and depression of scapula Scaption, focusing on proper movement and good control Internal and External rotation utilizing a theraband, with elbow tucked at side entire time Rows with theraband    Proper technique shown and discussed handout in great detail with ATC.  All questions were discussed and answered.     Impression and Recommendations:     The above documentation has been reviewed and is accurate and complete Lyndal Pulley, DO

## 2021-07-18 ENCOUNTER — Ambulatory Visit: Payer: Self-pay

## 2021-07-18 ENCOUNTER — Ambulatory Visit (INDEPENDENT_AMBULATORY_CARE_PROVIDER_SITE_OTHER): Payer: 59 | Admitting: Family Medicine

## 2021-07-18 ENCOUNTER — Ambulatory Visit (INDEPENDENT_AMBULATORY_CARE_PROVIDER_SITE_OTHER): Payer: 59

## 2021-07-18 ENCOUNTER — Other Ambulatory Visit: Payer: Self-pay

## 2021-07-18 ENCOUNTER — Encounter: Payer: Self-pay | Admitting: Family Medicine

## 2021-07-18 VITALS — BP 122/86 | HR 68 | Ht 69.0 in | Wt 191.0 lb

## 2021-07-18 DIAGNOSIS — M25512 Pain in left shoulder: Secondary | ICD-10-CM

## 2021-07-18 DIAGNOSIS — G8929 Other chronic pain: Secondary | ICD-10-CM

## 2021-07-18 DIAGNOSIS — G2589 Other specified extrapyramidal and movement disorders: Secondary | ICD-10-CM

## 2021-07-18 DIAGNOSIS — M79672 Pain in left foot: Secondary | ICD-10-CM

## 2021-07-18 DIAGNOSIS — M542 Cervicalgia: Secondary | ICD-10-CM

## 2021-07-18 DIAGNOSIS — M47812 Spondylosis without myelopathy or radiculopathy, cervical region: Secondary | ICD-10-CM | POA: Diagnosis not present

## 2021-07-18 DIAGNOSIS — G5782 Other specified mononeuropathies of left lower limb: Secondary | ICD-10-CM

## 2021-07-18 NOTE — Patient Instructions (Addendum)
Xray today New exercises Injected foot with marcaine and Kenalog See me in 6-8 weeks

## 2021-07-18 NOTE — Assessment & Plan Note (Signed)
Patient given injection today and tolerated the procedure well.  I did discuss that patient does not support the loss of the transverse arch that this can continue to give more difficulty.  Discussed icing regimen and home exercises.  Increase activity slowly.  Follow-up again in 6 to 8 weeks.

## 2021-07-30 ENCOUNTER — Other Ambulatory Visit (HOSPITAL_COMMUNITY): Payer: Self-pay

## 2021-07-31 ENCOUNTER — Other Ambulatory Visit (HOSPITAL_COMMUNITY): Payer: Self-pay

## 2021-09-03 NOTE — Progress Notes (Signed)
[place] Phone: [phone] Subjective:   Kirk [name], am serving as a scribe for Dr. Hulan Saas. This visit occurred during the SARS-CoV-2 public health emergency.  Safety protocols were in place, including screening questions prior to the visit, additional usage of staff PPE, and extensive cleaning of exam room while observing appropriate contact time as indicated for disinfecting solutions.  I'm seeing this patient by the request  of:  Kirk Bowen, MD  CC: Left shoulder pain follow-up  [id number]  07/18/2021 Patient given injection today and tolerated the procedure well.  I did discuss that patient does not support the loss of the transverse arch that this can continue to give more difficulty.  Discussed icing regimen and home exercises.  Increase activity slowly.  Follow-up again in 6 to 8 weeks.   Kirk [name] is a 66 y.o. male coming in with complaint of L foot, L shoulder, and neck pain. Patient states that his shoulder has not improved. Pain with abduction and flexion. Patient has been doing HEP and stretching more often.   Neck musculature is stiff but not as painful as it once was.   Injection seemed to help foot pain but feels that it may be wearing off. Has pinching sensation between 4th and 5th but it is more of an annoyance than pain. Patient also discusses 3rd toe pain that is intermittent.   Cspine xray 07/18/2021 IMPRESSION: Mild multilevel degenerative changes.  L shoulder xray 07/18/2021 IMPRESSION: Negative.       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[facility])    Past Surgical History:  Procedure Laterality Date   DENTAL SURGERY     TONSILLECTOMY     Social History   Socioeconomic History   Marital status: Married    Spouse name: Not on file   Number of children: Not on file   Years of education: Not on file   Highest  education level: Not on file  Occupational History   Not on file  Tobacco Use   Smoking status: Never   Smokeless tobacco: Never  Substance and Sexual Activity   Alcohol use: Yes   Drug use: [name]   Sexual activity: Not on file  Other Topics Concern   Not on file  Social History Narrative   Not on file   Social Determinants of Health   Financial Resource Strain: Not on file  Food Insecurity: Not on file  Transportation Needs: Not on file  Physical Activity: Not on file  Stress: Not on file  Social Connections: Not on file   [name] Known Allergies Family History  Problem Relation Age of Onset   Congestive Heart Failure Father    Heart disease Father    Parkinson's disease Mother    Stroke Mother     Current Outpatient Medications (Endocrine & Metabolic):    levothyroxine (SYNTHROID) 88 MCG tablet, TAKE 1 TABLET BY MOUTH ONCE DAILY AND TAKE 1 EXTRA TABLET ON SUNDAYS   levothyroxine (SYNTHROID, LEVOTHROID) 88 MCG tablet, Take 88 mcg by mouth daily before breakfast.   predniSONE (DELTASONE) 20 MG tablet, TAKE 1 TABLET BY MOUTH ONCE A DAY WITH BREAKFAST   testosterone (ANDROGEL) 50 MG/5GM (1%) GEL, Apply 5 grams onto the skin daily.   testosterone (ANDROGEL) 50 MG/5GM (1%) GEL, APPLY 1 PACKET TO SKIN ONCE DAILY AS DIRECTED   testosterone (ANDROGEL) 50 MG/5GM (1%) GEL, APPLY 1  PACKET TO SKIN ONCE DAILY AS DIRECTED   levothyroxine (SYNTHROID) 88 MCG tablet, TAKE 1 TABLET BY MOUTH ONCE DAILY AND 1 EXTRA TABLET ON SUNDAYS   testosterone (ANDROGEL) 50 MG/5GM (1%) GEL, APPLY 1 PACKET TO SKIN ONCE DAILY AS DIRECTED   testosterone (ANDROGEL) 50 MG/5GM (1%) GEL, APPLY 1 PACKET TO SKIN ONCE DAILY AS DIRECTED  Current Outpatient Medications (Cardiovascular):    amLODipine (NORVASC) 5 MG tablet, Take 1 tablet (5 mg total) by mouth daily.   ezetimibe (ZETIA) 10 MG tablet, Take 10 mg by mouth daily.   ezetimibe (ZETIA) 10 MG tablet, TAKE 1 TABLET BY MOUTH ONCE DAILY   ezetimibe (ZETIA) 10 MG  tablet, TAKE 1 TABLET BY MOUTH ONCE DAILY   losartan-hydrochlorothiazide (HYZAAR) 100-25 MG tablet, Take 0.5 tablets by mouth daily. Please make overdue appt with Dr. Tamala Julian before anymore refills. 2nd attempt   losartan-hydrochlorothiazide (HYZAAR) 50-12.5 MG tablet, TAKE 1 TABLET BY MOUTH DAILY   simvastatin (ZOCOR) 40 MG tablet, Take 40 mg by mouth daily.   simvastatin (ZOCOR) 40 MG tablet, TAKE 1 TABLET BY MOUTH ONCE DAILY   losartan-hydrochlorothiazide (HYZAAR) 50-12.5 MG tablet, TAKE 1 TABLET BY MOUTH DAILY   losartan-hydrochlorothiazide (HYZAAR) 50-12.5 MG tablet, TAKE 1 TABLET BY MOUTH DAILY   Current Outpatient Medications (Analgesics):    aspirin EC 81 MG tablet, Take 81 mg by mouth daily.   Naproxen Sodium 220 MG CAPS, Take 220 mg by mouth 2 (two) times daily as needed (pain).   Current Outpatient Medications (Other):    Diclofenac Sodium (PENNSAID) 2 % SOLN, Place 2 g onto the skin 2 (two) times daily.   gabapentin (NEURONTIN) 100 MG capsule, TAKE 2 CAPSULES BY MOUTH AT BEDTIME   Melatonin 5 MG TABS, Take 5 mg by mouth at bedtime.   Multiple Vitamins-Minerals (CENTRUM SILVER PO), Take half (1/2) tablet by mouth every other day.   traZODone (DESYREL) 50 MG tablet, Take 50 mg by mouth at bedtime.   traZODone (DESYREL) 50 MG tablet, TAKE 1 TABLET BY MOUTH EACH NIGHT AT BEDTIME AS NEEDED   Reviewed prior external information including notes and imaging from  primary care provider As well as notes that were available from care everywhere and other healthcare systems.  Past medical history, social, surgical and family history all reviewed in electronic medical record.  [name] pertanent information unless stated regarding to the chief complaint.   Review of Systems:  [name] headache, visual changes, nausea, vomiting, diarrhea, constipation, dizziness, abdominal pain, skin rash, fevers, chills, night sweats, weight loss, swollen lymph nodes, body aches, joint swelling, chest pain, shortness  of breath, mood changes. POSITIVE muscle aches  Objective  Blood pressure 120/84, pulse 62, height 5\' 9"  (1.753 m), weight 188 lb (85.3 kg), SpO2 98 %.   General: [name] apparent distress alert and oriented x3 mood and affect normal, dressed appropriately.  HEENT: Pupils equal, extraocular movements intact  Respiratory: Patient's speak in full sentences and does not appear short of breath  Cardiovascular: [name] lower extremity edema, non tender, [name] erythema  Gait normal with good balance and coordination.  MSK: Patient back exam does have some mild loss of doses.  Patient's left shoulder does have positive impingement noted with Neer and Hawkins.  Worse with Hawkins.  [name] significant weakness noted.  Shoulder: left Inspection reveals [name] abnormalities, atrophy or asymmetry. Palpation is normal with [name] tenderness over AC joint or bicipital groove. ROM is full in all planes passively. Rotator cuff strength normal throughout. signs of impingement  with positive Neer and Hawkin's tests, but negative empty can sign. Speeds and Yergason's tests normal. [name] labral pathology noted with negative Obrien's, negative clunk and good stability. Normal scapular function observed. [name] painful arc and [name] drop arm sign. [name] apprehension sign  MSK US performed of: left This study was ordered, performed, and interpreted by Charlann Boxer D.O.  Shoulder:   Supraspinatus:  Appears normal on long and transverse views, Bursal bulge seen with shoulder abduction on impingement view. Infraspinatus:  Appears normal on long and transverse views. Significant increase in Doppler flow Subscapularis:  Appears normal on long and transverse views. Positive bursa Teres Minor:  Appears normal on long and transverse views. AC joint:  Capsule undistended, [name] geyser sign. Glenohumeral Joint:  Appears normal without effusion. Glenoid Labrum:  Intact without visualized tears. Biceps Tendon:  Appears normal on long and transverse views, [name]  fraying of tendon, tendon located in intertubercular groove, [name] subluxation with shoulder internal or external rotation.  Impression: Subacromial bursitis  Procedure: Real-time Ultrasound Guided Injection of left glenohumeral joint Device: GE Logiq E  Ultrasound guided injection is preferred based studies that show increased duration, increased effect, greater accuracy, decreased procedural pain, increased response rate with ultrasound guided versus blind injection.  Verbal informed consent obtained.  Time-out conducted.  Noted [name] overlying erythema, induration, or other signs of local infection.  Skin prepped in a sterile fashion.  Local anesthesia: Topical Ethyl chloride.  With sterile technique and under real time ultrasound guidance:  Joint visualized.  23g 1  inch needle inserted lateral approach. Pictures taken for needle placement. Patient did have injection of 2 cc of 0.5% Marcaine, and 1.0 cc of Kenalog 40 mg/dL. Completed without difficulty  Pain immediately resolved suggesting accurate placement of the medication.  Advised to call if fevers/chills, erythema, induration, drainage, or persistent bleeding.  Impression: Technically successful ultrasound guided injection.    Impression and Recommendations:     The above documentation has been reviewed and is accurate and complete Lyndal Pulley, DO

## 2021-09-04 ENCOUNTER — Encounter: Payer: Self-pay | Admitting: Family Medicine

## 2021-09-04 ENCOUNTER — Ambulatory Visit: Payer: 59 | Admitting: Family Medicine

## 2021-09-04 ENCOUNTER — Ambulatory Visit: Payer: Self-pay

## 2021-09-04 ENCOUNTER — Other Ambulatory Visit: Payer: Self-pay

## 2021-09-04 VITALS — BP 120/84 | HR 62 | Ht 69.0 in | Wt 188.0 lb

## 2021-09-04 DIAGNOSIS — M79672 Pain in left foot: Secondary | ICD-10-CM

## 2021-09-04 DIAGNOSIS — M7552 Bursitis of left shoulder: Secondary | ICD-10-CM

## 2021-09-04 NOTE — Assessment & Plan Note (Addendum)
Patient given injection today and tolerated the procedure well, discussed icing regimen and home exercises, patient does have the home exercises that I think will be beneficial.  Discussed increasing activity and icing regimen.  Follow-up with me again in 6 to 8 weeks worsening pain or recurrent inflammation we will need to consider the possibility of advanced imaging.

## 2021-09-04 NOTE — Patient Instructions (Addendum)
Injected subacromial space in shoulder today See me again in 4-6 weeks

## 2021-09-28 NOTE — Progress Notes (Unsigned)
Kirk [name] Phone: [phone] Subjective:    I'm seeing this patient by the request  of:  Kirk Bowen, MD  CC:   [id number]  09/04/2021 Patient given injection today and tolerated the procedure well, discussed icing regimen and home exercises, patient does have the home exercises that I think will be beneficial.  Discussed increasing activity and icing regimen.  Follow-up with me again in 6 to 8 weeks worsening pain or recurrent inflammation we will need to consider the possibility of advanced imaging.  Update 10/03/2021 Kirk [name] is a 66 y.o. male coming in with complaint of L foot and L shoulder pain. 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[facility])    Past Surgical History:  Procedure Laterality Date   DENTAL SURGERY     TONSILLECTOMY     Social History   Socioeconomic History   Marital status: Married    Spouse name: Not on file   Number of children: Not on file   Years of education: Not on file   Highest education level: Not on file  Occupational History   Not on file  Tobacco Use   Smoking status: Never   Smokeless tobacco: Never  Substance and Sexual Activity   Alcohol use: Yes   Drug use: No   Sexual activity: Not on file  Other Topics Concern   Not on file  Social History Narrative   Not on file   Social Determinants of Health   Financial Resource Strain: Not on file  Food Insecurity: Not on file  Transportation Needs: Not on file  Physical Activity: Not on file  Stress: Not on file  Social Connections: Not on file   No Known Allergies Family History  Problem Relation Age of Onset   Congestive Heart Failure Father    Heart disease Father    Parkinson's disease Mother    Stroke Mother     Current Outpatient Medications (Endocrine & Metabolic):    levothyroxine (SYNTHROID) 88 MCG tablet, TAKE 1 TABLET  BY MOUTH ONCE DAILY AND 1 EXTRA TABLET ON SUNDAYS   levothyroxine (SYNTHROID) 88 MCG tablet, TAKE 1 TABLET BY MOUTH ONCE DAILY AND TAKE 1 EXTRA TABLET ON SUNDAYS   levothyroxine (SYNTHROID, LEVOTHROID) 88 MCG tablet, Take 88 mcg by mouth daily before breakfast.   predniSONE (DELTASONE) 20 MG tablet, TAKE 1 TABLET BY MOUTH ONCE A DAY WITH BREAKFAST   testosterone (ANDROGEL) 50 MG/5GM (1%) GEL, Apply 5 grams onto the skin daily.   testosterone (ANDROGEL) 50 MG/5GM (1%) GEL, APPLY 1 PACKET TO SKIN ONCE DAILY AS DIRECTED   testosterone (ANDROGEL) 50 MG/5GM (1%) GEL, APPLY 1 PACKET TO SKIN ONCE DAILY AS DIRECTED   testosterone (ANDROGEL) 50 MG/5GM (1%) GEL, APPLY 1 PACKET TO SKIN ONCE DAILY AS DIRECTED   testosterone (ANDROGEL) 50 MG/5GM (1%) GEL, APPLY 1 PACKET TO SKIN ONCE DAILY AS DIRECTED  Current Outpatient Medications (Cardiovascular):    amLODipine (NORVASC) 5 MG tablet, Take 1 tablet (5 mg total) by mouth daily.   ezetimibe (ZETIA) 10 MG tablet, Take 10 mg by mouth daily.   ezetimibe (ZETIA) 10 MG tablet, TAKE 1 TABLET BY MOUTH ONCE DAILY   ezetimibe (ZETIA) 10 MG tablet, TAKE 1 TABLET BY MOUTH ONCE DAILY   losartan-hydrochlorothiazide (HYZAAR) 100-25 MG tablet, Take 0.5 tablets by mouth daily. Please make overdue appt  with Dr. Tamala Julian before anymore refills. 2nd attempt   losartan-hydrochlorothiazide (HYZAAR) 50-12.5 MG tablet, TAKE 1 TABLET BY MOUTH DAILY   losartan-hydrochlorothiazide (HYZAAR) 50-12.5 MG tablet, TAKE 1 TABLET BY MOUTH DAILY   losartan-hydrochlorothiazide (HYZAAR) 50-12.5 MG tablet, TAKE 1 TABLET BY MOUTH DAILY   simvastatin (ZOCOR) 40 MG tablet, Take 40 mg by mouth daily.   simvastatin (ZOCOR) 40 MG tablet, TAKE 1 TABLET BY MOUTH ONCE DAILY   Current Outpatient Medications (Analgesics):    aspirin EC 81 MG tablet, Take 81 mg by mouth daily.   Naproxen Sodium 220 MG CAPS, Take 220 mg by mouth 2 (two) times daily as needed (pain).   Current Outpatient Medications  (Other):    Diclofenac Sodium (PENNSAID) 2 % SOLN, Place 2 g onto the skin 2 (two) times daily.   gabapentin (NEURONTIN) 100 MG capsule, TAKE 2 CAPSULES BY MOUTH AT BEDTIME   Melatonin 5 MG TABS, Take 5 mg by mouth at bedtime.   Multiple Vitamins-Minerals (CENTRUM SILVER PO), Take half (1/2) tablet by mouth every other day.   traZODone (DESYREL) 50 MG tablet, Take 50 mg by mouth at bedtime.   traZODone (DESYREL) 50 MG tablet, TAKE 1 TABLET BY MOUTH EACH NIGHT AT BEDTIME AS NEEDED   Reviewed prior external information including notes and imaging from  primary care provider As well as notes that were available from care everywhere and other healthcare systems.  Past medical history, social, surgical and family history all reviewed in electronic medical record.  No pertanent information unless stated regarding to the chief complaint.   Review of Systems:  No headache, visual changes, nausea, vomiting, diarrhea, constipation, dizziness, abdominal pain, skin rash, fevers, chills, night sweats, weight loss, swollen lymph nodes, body aches, joint swelling, chest pain, shortness of breath, mood changes. POSITIVE muscle aches  Objective  There were no vitals taken for this visit.   General: No apparent distress alert and oriented x3 mood and affect normal, dressed appropriately.  HEENT: Pupils equal, extraocular movements intact  Respiratory: Patient's speak in full sentences and does not appear short of breath  Cardiovascular: No lower extremity edema, non tender, no erythema  Gait normal with good balance and coordination.  MSK:  Non tender with full range of motion and good stability and symmetric strength and tone of shoulders, elbows, wrist, hip, knee and ankles bilaterally.     Impression and Recommendations:     The above documentation has been reviewed and is accurate and complete Kirk [name]

## 2021-10-03 ENCOUNTER — Encounter: Payer: Self-pay | Admitting: Family Medicine

## 2021-10-03 ENCOUNTER — Other Ambulatory Visit: Payer: Self-pay

## 2021-10-03 ENCOUNTER — Ambulatory Visit: Payer: Self-pay

## 2021-10-03 ENCOUNTER — Ambulatory Visit: Payer: 59 | Admitting: Family Medicine

## 2021-10-03 VITALS — BP 122/82 | HR 75 | Ht 69.0 in | Wt 205.0 lb

## 2021-10-03 DIAGNOSIS — M79672 Pain in left foot: Secondary | ICD-10-CM | POA: Diagnosis not present

## 2021-10-03 DIAGNOSIS — G5782 Other specified mononeuropathies of left lower limb: Secondary | ICD-10-CM

## 2021-10-03 DIAGNOSIS — M7552 Bursitis of left shoulder: Secondary | ICD-10-CM | POA: Diagnosis not present

## 2021-10-03 NOTE — Assessment & Plan Note (Signed)
Patient is significantly better after the injection at this point.  Discussed icing regimen and home exercises.  Discussed which activities to do and which ones to avoid.  Follow-up with me again in 6-8 ?

## 2021-10-03 NOTE — Patient Instructions (Addendum)
Good to see you ? ?Arm compression sleeve when working out for the biceps ? ?Hammer curls ? ?Heat before activity and ice after after ? ?Foot is improving, but is slow ? ?See me again in  2 months ?

## 2021-10-03 NOTE — Assessment & Plan Note (Signed)
Patient is improving but continues to have some discomfort.  He is doing 50% better.  On ultrasound does appear to be smaller than the left.  Follow-up again in 6 to 8 weeks ?

## 2021-10-09 ENCOUNTER — Other Ambulatory Visit (HOSPITAL_COMMUNITY): Payer: Self-pay

## 2021-10-10 ENCOUNTER — Other Ambulatory Visit (HOSPITAL_COMMUNITY): Payer: Self-pay

## 2021-10-10 MED ORDER — SIMVASTATIN 40 MG PO TABS
40.0000 mg | ORAL_TABLET | Freq: Every day | ORAL | 2 refills | Status: DC
  Filled 2021-10-10: qty 90, 90d supply, fill #0
  Filled 2021-12-31: qty 90, 90d supply, fill #1
  Filled 2022-03-26: qty 90, 90d supply, fill #2

## 2021-10-13 ENCOUNTER — Other Ambulatory Visit (HOSPITAL_COMMUNITY): Payer: Self-pay

## 2021-10-26 ENCOUNTER — Other Ambulatory Visit (HOSPITAL_COMMUNITY): Payer: Self-pay

## 2021-10-26 MED ORDER — LEVOTHYROXINE SODIUM 88 MCG PO TABS
ORAL_TABLET | ORAL | 2 refills | Status: AC
  Filled 2021-10-26: qty 102, 90d supply, fill #0
  Filled 2022-01-17 – 2022-01-28 (×2): qty 102, 90d supply, fill #1
  Filled 2022-04-23: qty 102, 90d supply, fill #2

## 2021-11-05 ENCOUNTER — Other Ambulatory Visit (HOSPITAL_COMMUNITY): Payer: Self-pay

## 2021-11-05 MED ORDER — TESTOSTERONE 50 MG/5GM (1%) TD GEL
TRANSDERMAL | 0 refills | Status: DC
  Filled 2021-11-05: qty 150, 30d supply, fill #0

## 2021-11-06 ENCOUNTER — Other Ambulatory Visit (HOSPITAL_COMMUNITY): Payer: Self-pay

## 2021-11-23 ENCOUNTER — Other Ambulatory Visit (HOSPITAL_COMMUNITY): Payer: Self-pay

## 2021-11-28 DIAGNOSIS — H353131 Nonexudative age-related macular degeneration, bilateral, early dry stage: Secondary | ICD-10-CM | POA: Diagnosis not present

## 2021-11-28 DIAGNOSIS — H2513 Age-related nuclear cataract, bilateral: Secondary | ICD-10-CM | POA: Diagnosis not present

## 2021-11-28 DIAGNOSIS — H2511 Age-related nuclear cataract, right eye: Secondary | ICD-10-CM | POA: Diagnosis not present

## 2021-11-28 DIAGNOSIS — H18413 Arcus senilis, bilateral: Secondary | ICD-10-CM | POA: Diagnosis not present

## 2021-11-28 DIAGNOSIS — H25043 Posterior subcapsular polar age-related cataract, bilateral: Secondary | ICD-10-CM | POA: Diagnosis not present

## 2021-11-29 NOTE — Progress Notes (Signed)
?Charlann Boxer D.O. ?[facility] ?[place] ?Phone: [phone] ?Subjective:   ?I, Vilma Meckel, am serving as a Education administrator for Dr. Hulan Saas. ?This visit occurred during the SARS-CoV-2 public health emergency.  Safety protocols were in place, including screening questions prior to the visit, additional usage of staff PPE, and extensive cleaning of exam room while observing appropriate contact time as indicated for disinfecting solutions.  ? ?I'm seeing this patient by the request  of:  [name], MD ? ?CC: Left shoulder pain ? ?[id number]  ?10/03/2021 ?Patient is improving but continues to have some discomfort.  He is doing 50% better.  On ultrasound does appear to be smaller than the left.  Follow-up again in 6 to 8 weeks ? ?Patient is significantly better after the injection at this point.  Discussed icing regimen and home exercises.  Discussed which activities to do and which ones to avoid.  Follow-up with me again in 6-8 ? ?Update 11/30/2021 ?AHRON HULBERT is a 66 y.o. male coming in with complaint of L shoulder and L foot. Patient states no more pinching in the left foot. MTP joints in foot will get tender when standing or walking a while. Shoulder feels about the same. Being more conscientious of his movements. No new complaints. ? ? ? ?  ? ?Past Medical History:  ?Diagnosis Date  ? Hypertension   ? Pituitary gland disorder ([place])   ? ?Past Surgical History:  ?Procedure Laterality Date  ? DENTAL SURGERY    ? TONSILLECTOMY    ? ?Social History  ? ?Socioeconomic History  ? Marital status: Married  ?  Spouse name: Not on file  ? Number of children: Not on file  ? Years of education: Not on file  ? Highest education level: Not on file  ?Occupational History  ? Not on file  ?Tobacco Use  ? Smoking status: Never  ? Smokeless tobacco: Never  ?Substance and Sexual Activity  ? Alcohol use: Yes  ? Drug use: No  ? Sexual activity: Not on file  ?Other Topics Concern  ? Not on  file  ?Social History Narrative  ? Not on file  ? ?Social Determinants of Health  ? ?Financial Resource Strain: Not on file  ?Food Insecurity: Not on file  ?Transportation Needs: Not on file  ?Physical Activity: Not on file  ?Stress: Not on file  ?Social Connections: Not on file  ? ?No Known Allergies ?Family History  ?Problem Relation Age of Onset  ? Congestive Heart Failure Father   ? Heart disease Father   ? Parkinson's disease Mother   ? Stroke Mother   ? ? ?Current Outpatient Medications (Endocrine & Metabolic):  ?  levothyroxine (SYNTHROID) 88 MCG tablet, TAKE 1 TABLET BY MOUTH ONCE DAILY AND 1 EXTRA TABLET ON SUNDAYS ?  levothyroxine (SYNTHROID) 88 MCG tablet, TAKE 1 TABLET BY MOUTH ONCE DAILY AND TAKE 1 EXTRA TABLET ON SUNDAYS ?  levothyroxine (SYNTHROID, LEVOTHROID) 88 MCG tablet, Take 88 mcg by mouth daily before breakfast. ?  testosterone (ANDROGEL) 50 MG/5GM (1%) GEL, Apply 5 grams onto the skin daily. ?  testosterone (ANDROGEL) 50 MG/5GM (1%) GEL, APPLY 1 PACKET TO SKIN ONCE DAILY AS DIRECTED ?  testosterone (ANDROGEL) 50 MG/5GM (1%) GEL, APPLY 1 PACKET TO SKIN ONCE DAILY AS DIRECTED ?  testosterone (ANDROGEL) 50 MG/5GM (1%) GEL, APPLY 1 PACKET TO SKIN ONCE DAILY AS DIRECTED ?  testosterone (ANDROGEL) 50 MG/5GM (1%) GEL, APPLY 1 PACKET TO SKIN  ONCE DAILY AS DIRECTED ?  testosterone (ANDROGEL) 50 MG/5GM (1%) GEL, APPLY 1 PACKET TO SKIN ONCE DAILY AS DIRECTED ? ?Current Outpatient Medications (Cardiovascular):  ?  amLODipine (NORVASC) 5 MG tablet, Take 1 tablet (5 mg total) by mouth daily. ?  ezetimibe (ZETIA) 10 MG tablet, Take 10 mg by mouth daily. ?  ezetimibe (ZETIA) 10 MG tablet, TAKE 1 TABLET BY MOUTH ONCE DAILY ?  ezetimibe (ZETIA) 10 MG tablet, TAKE 1 TABLET BY MOUTH ONCE DAILY ?  losartan-hydrochlorothiazide (HYZAAR) 100-25 MG tablet, Take 0.5 tablets by mouth daily. Please make overdue appt with Dr. Tamala Julian before anymore refills. 2nd attempt ?  losartan-hydrochlorothiazide (HYZAAR) 50-12.5 MG  tablet, TAKE 1 TABLET BY MOUTH DAILY ?  losartan-hydrochlorothiazide (HYZAAR) 50-12.5 MG tablet, TAKE 1 TABLET BY MOUTH DAILY ?  losartan-hydrochlorothiazide (HYZAAR) 50-12.5 MG tablet, TAKE 1 TABLET BY MOUTH DAILY ?  simvastatin (ZOCOR) 40 MG tablet, Take 40 mg by mouth daily. ?  simvastatin (ZOCOR) 40 MG tablet, TAKE 1 TABLET BY MOUTH ONCE DAILY ? ? ?Current Outpatient Medications (Analgesics):  ?  aspirin EC 81 MG tablet, Take 81 mg by mouth daily. ?  Naproxen Sodium 220 MG CAPS, Take 220 mg by mouth 2 (two) times daily as needed (pain). ? ? ?Current Outpatient Medications (Other):  ?  Diclofenac Sodium (PENNSAID) 2 % SOLN, Place 2 g onto the skin 2 (two) times daily. ?  gabapentin (NEURONTIN) 100 MG capsule, TAKE 2 CAPSULES BY MOUTH AT BEDTIME ?  Melatonin 5 MG TABS, Take 5 mg by mouth at bedtime. ?  Multiple Vitamins-Minerals (CENTRUM SILVER PO), Take half (1/2) tablet by mouth every other day. ?  traZODone (DESYREL) 50 MG tablet, Take 50 mg by mouth at bedtime. ?  traZODone (DESYREL) 50 MG tablet, TAKE 1 TABLET BY MOUTH EACH NIGHT AT BEDTIME AS NEEDED ? ? ?Reviewed prior external information including notes and imaging from  ?primary care provider ?As well as notes that were available from care everywhere and other healthcare systems. ? ?Past medical history, social, surgical and family history all reviewed in electronic medical record.  No pertanent information unless stated regarding to the chief complaint.  ? ?Review of Systems: ? No headache, visual changes, nausea, vomiting, diarrhea, constipation, dizziness, abdominal pain, skin rash, fevers, chills, night sweats, weight loss, swollen lymph nodes, body aches, joint swelling, chest pain, shortness of breath, mood changes. POSITIVE muscle aches ? ?Objective  ?Blood pressure 116/84, pulse 66, height '5\' 9"'$  (1.753 m), weight 181 lb (82.1 kg), SpO2 97 %. ?  ?General: No apparent distress alert and oriented x3 mood and affect normal, dressed appropriately.   ?HEENT: Pupils equal, extraocular movements intact  ?Respiratory: Patient's speak in full sentences and does not appear short of breath  ?Cardiovascular: No lower extremity edema, non tender, no erythema  ?Gait normal with good balance and coordination.  ?MSK: Foot exam does show breakdown of the transverse arch noted. ?Patient does have very mild tenderness more of the metatarsal heads.  Patient does have 1 area of the lateral heel of both shoes significantly. ? ?Procedure: Real-time Ultrasound Guided Injection of left glenohumeral joint ?Device: GE Logiq E  ?Ultrasound guided injection is preferred based studies that show increased duration, increased effect, greater accuracy, decreased procedural pain, increased response rate with ultrasound guided versus blind injection.  ?Verbal informed consent obtained.  ?Time-out conducted.  ?Noted no overlying erythema, induration, or other signs of local infection.  ?Skin prepped in a sterile fashion.  ?Local anesthesia: Topical Ethyl  chloride.  ?With sterile technique and under real time ultrasound guidance:  Joint visualized.  21g 2 inch needle inserted posterior approach. Pictures taken for needle placement. Patient did have injection of 2 cc of 0.5% Marcaine, and 1cc of Kenalog 40 mg/dL. ?Completed without difficulty  ?Pain immediately resolved suggesting accurate placement of the medication.  ?Advised to call if fevers/chills, erythema, induration, drainage, or persistent bleeding.  ?Impression: Technically successful ultrasound guided injection. ?  ?Impression and Recommendations:  ?  ? ?The above documentation has been reviewed and is accurate and complete Lyndal Pulley, DO ? ? ? ?

## 2021-12-04 ENCOUNTER — Ambulatory Visit: Payer: Self-pay

## 2021-12-04 ENCOUNTER — Encounter: Payer: Self-pay | Admitting: Family Medicine

## 2021-12-04 ENCOUNTER — Ambulatory Visit: Payer: 59 | Admitting: Family Medicine

## 2021-12-04 VITALS — BP 116/84 | HR 66 | Ht 69.0 in | Wt 181.0 lb

## 2021-12-04 DIAGNOSIS — M216X9 Other acquired deformities of unspecified foot: Secondary | ICD-10-CM | POA: Diagnosis not present

## 2021-12-04 DIAGNOSIS — M7552 Bursitis of left shoulder: Secondary | ICD-10-CM

## 2021-12-04 DIAGNOSIS — G5782 Other specified mononeuropathies of left lower limb: Secondary | ICD-10-CM | POA: Diagnosis not present

## 2021-12-04 NOTE — Patient Instructions (Signed)
Good to see you  ?Ice 20 minutes 2 times daily. Usually after activity and before bed. ?Have a good cruise ?The foot we will get you new orthotics soon and they will call you  ?Send me a message in 2-3 weeks and tell me how you are doing  ?See me again in 2 months otherwise  ?

## 2021-12-04 NOTE — Assessment & Plan Note (Signed)
Discussed with patient about different treatment options and patient had elected to try the steroid injection in the glenohumeral joint.  We discussed with patient if this continues to give him difficulty we do need to consider advanced imaging.  Patient will be leaving on a cruise and wanted to do injection to help him with some of the pain control.  Has had different medications over the course of time including topical anti-inflammatories, gabapentin, naproxen.  Follow-up with me again in 4 to 6 weeks but if worsening pain advanced imaging with an MR arthrogram is necessary. ?

## 2021-12-04 NOTE — Assessment & Plan Note (Signed)
Patient has had breakdown of the transverse arch.  Patient does have a over supination noted of the midfoot.  We will get patient in custom orthotics that I think will be more beneficial.  Follow-up with me again 6 to 8 weeks.  Patient will be on a cruise in till June so would likely will be set up after that timeframe ?

## 2021-12-06 ENCOUNTER — Other Ambulatory Visit (HOSPITAL_COMMUNITY): Payer: Self-pay

## 2021-12-06 MED ORDER — BESIVANCE 0.6 % OP SUSP
OPHTHALMIC | 1 refills | Status: DC
  Filled 2021-12-06: qty 5, 30d supply, fill #0
  Filled 2022-01-02: qty 5, 30d supply, fill #1

## 2021-12-06 MED ORDER — PREDNISOLONE ACETATE 1 % OP SUSP
OPHTHALMIC | 1 refills | Status: AC
  Filled 2021-12-06: qty 5, 7d supply, fill #0
  Filled 2022-01-02: qty 5, 7d supply, fill #1

## 2021-12-06 MED ORDER — KETOROLAC TROMETHAMINE 0.5 % OP SOLN
OPHTHALMIC | 1 refills | Status: DC
Start: 2021-12-06 — End: 2022-01-09
  Filled 2021-12-06: qty 5, 30d supply, fill #0
  Filled 2022-01-02: qty 5, 30d supply, fill #1

## 2021-12-07 ENCOUNTER — Other Ambulatory Visit (HOSPITAL_COMMUNITY): Payer: Self-pay

## 2021-12-19 ENCOUNTER — Other Ambulatory Visit (HOSPITAL_COMMUNITY): Payer: Self-pay

## 2021-12-19 MED ORDER — TESTOSTERONE 50 MG/5GM (1%) TD GEL
TRANSDERMAL | 5 refills | Status: DC
  Filled 2021-12-19: qty 150, 30d supply, fill #0
  Filled 2022-01-14: qty 150, 30d supply, fill #1
  Filled 2022-02-08: qty 150, 30d supply, fill #2
  Filled 2022-03-11: qty 150, 30d supply, fill #3
  Filled 2022-04-08: qty 150, 30d supply, fill #4
  Filled 2022-05-22: qty 150, 30d supply, fill #5

## 2021-12-20 ENCOUNTER — Other Ambulatory Visit (HOSPITAL_COMMUNITY): Payer: Self-pay

## 2021-12-21 ENCOUNTER — Other Ambulatory Visit (HOSPITAL_COMMUNITY): Payer: Self-pay

## 2021-12-31 ENCOUNTER — Other Ambulatory Visit (HOSPITAL_COMMUNITY): Payer: Self-pay

## 2021-12-31 DIAGNOSIS — E559 Vitamin D deficiency, unspecified: Secondary | ICD-10-CM | POA: Diagnosis not present

## 2021-12-31 DIAGNOSIS — R251 Tremor, unspecified: Secondary | ICD-10-CM | POA: Diagnosis not present

## 2021-12-31 DIAGNOSIS — E236 Other disorders of pituitary gland: Secondary | ICD-10-CM | POA: Diagnosis not present

## 2021-12-31 DIAGNOSIS — I5189 Other ill-defined heart diseases: Secondary | ICD-10-CM | POA: Diagnosis not present

## 2021-12-31 DIAGNOSIS — R7301 Impaired fasting glucose: Secondary | ICD-10-CM | POA: Diagnosis not present

## 2021-12-31 DIAGNOSIS — E039 Hypothyroidism, unspecified: Secondary | ICD-10-CM | POA: Diagnosis not present

## 2021-12-31 DIAGNOSIS — E291 Testicular hypofunction: Secondary | ICD-10-CM | POA: Diagnosis not present

## 2021-12-31 DIAGNOSIS — Z1339 Encounter for screening examination for other mental health and behavioral disorders: Secondary | ICD-10-CM | POA: Diagnosis not present

## 2021-12-31 DIAGNOSIS — E785 Hyperlipidemia, unspecified: Secondary | ICD-10-CM | POA: Diagnosis not present

## 2021-12-31 DIAGNOSIS — D751 Secondary polycythemia: Secondary | ICD-10-CM | POA: Diagnosis not present

## 2022-01-02 ENCOUNTER — Other Ambulatory Visit (HOSPITAL_COMMUNITY): Payer: Self-pay

## 2022-01-03 ENCOUNTER — Other Ambulatory Visit (HOSPITAL_COMMUNITY): Payer: Self-pay

## 2022-01-08 DIAGNOSIS — H2512 Age-related nuclear cataract, left eye: Secondary | ICD-10-CM | POA: Diagnosis not present

## 2022-01-09 ENCOUNTER — Other Ambulatory Visit (HOSPITAL_COMMUNITY): Payer: Self-pay

## 2022-01-09 MED ORDER — PREDNISOLONE ACETATE 1 % OP SUSP
OPHTHALMIC | 1 refills | Status: AC
  Filled 2022-01-09: qty 5, 22d supply, fill #0

## 2022-01-09 MED ORDER — KETOROLAC TROMETHAMINE 0.5 % OP SOLN
OPHTHALMIC | 1 refills | Status: AC
  Filled 2022-01-09: qty 5, 30d supply, fill #0

## 2022-01-09 MED ORDER — BESIVANCE 0.6 % OP SUSP
OPHTHALMIC | 1 refills | Status: AC
  Filled 2022-01-09: qty 5, 30d supply, fill #0

## 2022-01-14 ENCOUNTER — Other Ambulatory Visit (HOSPITAL_COMMUNITY): Payer: Self-pay

## 2022-01-15 ENCOUNTER — Other Ambulatory Visit (HOSPITAL_COMMUNITY): Payer: Self-pay

## 2022-01-17 ENCOUNTER — Other Ambulatory Visit (HOSPITAL_COMMUNITY): Payer: Self-pay

## 2022-01-21 ENCOUNTER — Other Ambulatory Visit (HOSPITAL_COMMUNITY): Payer: Self-pay

## 2022-01-21 ENCOUNTER — Encounter (HOSPITAL_COMMUNITY): Payer: Self-pay | Admitting: Pharmacist

## 2022-01-23 ENCOUNTER — Telehealth: Payer: Self-pay | Admitting: *Deleted

## 2022-01-23 DIAGNOSIS — M216X9 Other acquired deformities of unspecified foot: Secondary | ICD-10-CM

## 2022-01-23 NOTE — Telephone Encounter (Signed)
Pt has an appt with Dr. Tamala Julian on 7/26. Per pt, Dr. Tamala Julian mentioned getting some custom orthotics made. He would like to know if this can go ahead & be scheduled or does he need to wait until he sees Dr. Tamala Julian again? If he can go ahead & schedule an appt for orthotics, who do you want him to schedule an appt with?

## 2022-01-24 NOTE — Telephone Encounter (Signed)
Pl3ase send him to the fellowship

## 2022-01-24 NOTE — Telephone Encounter (Signed)
Referral entered  

## 2022-01-26 ENCOUNTER — Other Ambulatory Visit (HOSPITAL_COMMUNITY): Payer: Self-pay

## 2022-01-28 ENCOUNTER — Other Ambulatory Visit (HOSPITAL_COMMUNITY): Payer: Self-pay

## 2022-01-29 ENCOUNTER — Other Ambulatory Visit (HOSPITAL_COMMUNITY): Payer: Self-pay

## 2022-01-30 ENCOUNTER — Ambulatory Visit (INDEPENDENT_AMBULATORY_CARE_PROVIDER_SITE_OTHER): Payer: 59 | Admitting: Sports Medicine

## 2022-01-30 VITALS — BP 136/82 | Ht 69.0 in | Wt 175.0 lb

## 2022-01-30 DIAGNOSIS — M2141 Flat foot [pes planus] (acquired), right foot: Secondary | ICD-10-CM

## 2022-01-30 DIAGNOSIS — M2140 Flat foot [pes planus] (acquired), unspecified foot: Secondary | ICD-10-CM | POA: Insufficient documentation

## 2022-01-30 DIAGNOSIS — M2142 Flat foot [pes planus] (acquired), left foot: Secondary | ICD-10-CM | POA: Diagnosis not present

## 2022-01-30 NOTE — Assessment & Plan Note (Addendum)
Loss of longitudinal arch bilaterally, transverse arch present New orthotic made today with tarsal pad and lateral post. Recommend ambulating with the orthotics as often as possible for best result. Recommend gait analysis [employer] to best match ideal shoe for his anatomy with orthotic in place. Reach out to our clinic if any adjustment need to be made.

## 2022-01-30 NOTE — Progress Notes (Signed)
   Established Patient Office Visit  Subjective   Patient ID: Kirk [name], male    DOB: 08-Jun-1956  Age: 66 y.o. MRN: [id number]  Foot pain and orthotics  Kirk [name] is a 66yo male requesting orthotics as he feels the ones he has now are no longer beneficial and his foot is sliding around in them. Referred by Dr Tamala Julian. He has been in his old orthotics for years but over the past 2 weeks has removed them due to discomfort. They are rigid sole with lateral post. He experiences for soreness after ambulating even just in the grocery store. He ambulates mostly in hard sole tennis shoes. Denies any trauma to the area but reports he has had a pinched nerve in the past that resolved with physical therapy. He would like to get back to hiking and backpacking pain free.   ROS: as listed above in HPI     Objective:     BP 136/82   Ht '5\' 9"'$  (1.753 m)   Wt 175 lb (79.4 kg)   BMI 25.84 kg/m   Physical Exam Constitutional:      General: He is not in acute distress.    Appearance: Normal appearance. He is not ill-appearing or diaphoretic.  Neurological:     Mental Status: He is alert.    Feet: Pes planus b/l, toes with normal width between them when weight bearing. Mild discomfort to palpation over the metatarsal heads on the plantar aspect of the foot.  DP 2+/bl Mild L foot supination with ambulation, no antalgic gait noted.    Assessment & Plan:   Problem List Items Addressed This Visit       Other   Flat foot - Primary    Loss of longitudinal arch bilaterally, transverse arch present New orthotic made today with tarsal pad and lateral post. Recommend ambulating with the orthotics as often as possible for best result. Recommend gait analysis [employer] to best match ideal shoe for his anatomy with orthotic in place. Reach out to our clinic if any adjustment need to be made.        Return if symptoms worsen or fail to improve.    Elmore Guise, DO  Patient seen and  evaluated with the sports medicine fellow.  I agree with the above plan of care.  Custom orthotics were created as below.  If adjustments need to be made the patient may return to the office at his convenience for this.  Otherwise, return to Dr. Tamala Julian as scheduled and follow-up with me as needed.  Patient was fitted for a : standard, cushioned, semi-rigid orthotic. The orthotic was heated and afterward the patient stood on the orthotic blank positioned on the orthotic stand. The patient was positioned in subtalar neutral position and 10 degrees of ankle dorsiflexion in a weight bearing stance. After completion of molding, a stable base was applied to the orthotic blank. The blank was ground to a stable position for weight bearing. Size: 12 Base: Blue EVA Posting: Bilateral lateral heel wedges, bilateral metatarsal pads Additional orthotic padding: None

## 2022-02-08 ENCOUNTER — Other Ambulatory Visit (HOSPITAL_COMMUNITY): Payer: Self-pay

## 2022-02-11 ENCOUNTER — Other Ambulatory Visit (HOSPITAL_COMMUNITY): Payer: Self-pay

## 2022-02-12 NOTE — Progress Notes (Deleted)
Kirk [name] Phone: [phone] Subjective:    I'm seeing this patient by the request  of:  [name], MD  CC:   [id number]  12/04/2021 Discussed with patient about different treatment options and patient had elected to try the steroid injection in the glenohumeral joint.  We discussed with patient if this continues to give him difficulty we do need to consider advanced imaging.  Patient will be leaving on a cruise and wanted to do injection to help him with some of the pain control.  Has had different medications over the course of time including topical anti-inflammatories, gabapentin, naproxen.  Follow-up with me again in 4 to 6 weeks but if worsening pain advanced imaging with an MR arthrogram is necessary.  Patient has had breakdown of the transverse arch.  Patient does have a over supination noted of the midfoot.  We will get patient in custom orthotics that I think will be more beneficial.  Follow-up with me again 6 to 8 weeks.  Patient will be on a cruise in till June so would likely will be set up after that timeframe  Update 02/13/2022 Kirk [name] is a 66 y.o. male coming in with complaint of low back and B foot pain. Custom orthotics July 2023. 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[facility])    Past Surgical History:  Procedure Laterality Date   DENTAL SURGERY     TONSILLECTOMY     Social History   Socioeconomic History   Marital status: Married    Spouse name: Not on file   Number of children: Not on file   Years of education: Not on file   Highest education level: Not on file  Occupational History   Not on file  Tobacco Use   Smoking status: Never   Smokeless tobacco: Never  Substance and Sexual Activity   Alcohol use: Yes   Drug use: No   Sexual activity: Not on file  Other Topics Concern   Not on file  Social  History Narrative   Not on file   Social Determinants of Health   Financial Resource Strain: Not on file  Food Insecurity: Not on file  Transportation Needs: Not on file  Physical Activity: Not on file  Stress: Not on file  Social Connections: Not on file   No Known Allergies Family History  Problem Relation Age of Onset   Congestive Heart Failure Father    Heart disease Father    Parkinson's disease Mother    Stroke Mother     Current Outpatient Medications (Endocrine & Metabolic):    levothyroxine (SYNTHROID) 88 MCG tablet, TAKE 1 TABLET BY MOUTH ONCE DAILY AND 1 EXTRA TABLET ON SUNDAYS   levothyroxine (SYNTHROID) 88 MCG tablet, TAKE 1 TABLET BY MOUTH ONCE DAILY AND TAKE 1 EXTRA TABLET ON SUNDAYS   levothyroxine (SYNTHROID, LEVOTHROID) 88 MCG tablet, Take 88 mcg by mouth daily before breakfast.   testosterone (ANDROGEL) 50 MG/5GM (1%) GEL, Apply 5 grams onto the skin daily.   testosterone (ANDROGEL) 50 MG/5GM (1%) GEL, APPLY 1 PACKET TO SKIN ONCE DAILY AS DIRECTED   testosterone (ANDROGEL) 50 MG/5GM (1%) GEL, APPLY 1 PACKET TO SKIN ONCE DAILY AS DIRECTED   testosterone (ANDROGEL) 50 MG/5GM (1%) GEL, APPLY 1 PACKET TO SKIN ONCE DAILY AS DIRECTED   testosterone (ANDROGEL) 50 MG/5GM (1%) GEL,  APPLY 1 PACKET TO SKIN ONCE DAILY AS DIRECTED   testosterone (ANDROGEL) 50 MG/5GM (1%) GEL, APPLY 1 PACKET TO SKIN ONCE DAILY AS DIRECTED  Current Outpatient Medications (Cardiovascular):    amLODipine (NORVASC) 5 MG tablet, Take 1 tablet (5 mg total) by mouth daily.   ezetimibe (ZETIA) 10 MG tablet, Take 10 mg by mouth daily.   ezetimibe (ZETIA) 10 MG tablet, TAKE 1 TABLET BY MOUTH ONCE DAILY   ezetimibe (ZETIA) 10 MG tablet, TAKE 1 TABLET BY MOUTH ONCE DAILY   losartan-hydrochlorothiazide (HYZAAR) 100-25 MG tablet, Take 0.5 tablets by mouth daily. Please make overdue appt with Dr. Tamala Julian before anymore refills. 2nd attempt   losartan-hydrochlorothiazide (HYZAAR) 50-12.5 MG tablet, TAKE 1  TABLET BY MOUTH DAILY   losartan-hydrochlorothiazide (HYZAAR) 50-12.5 MG tablet, TAKE 1 TABLET BY MOUTH DAILY   losartan-hydrochlorothiazide (HYZAAR) 50-12.5 MG tablet, TAKE 1 TABLET BY MOUTH DAILY   simvastatin (ZOCOR) 40 MG tablet, Take 40 mg by mouth daily.   simvastatin (ZOCOR) 40 MG tablet, TAKE 1 TABLET BY MOUTH ONCE DAILY   Current Outpatient Medications (Analgesics):    aspirin EC 81 MG tablet, Take 81 mg by mouth daily.   Naproxen Sodium 220 MG CAPS, Take 220 mg by mouth 2 (two) times daily as needed (pain).   Current Outpatient Medications (Other):    Besifloxacin HCl (BESIVANCE) 0.6 % SUSP, Instill 1 drop into affected eye (Left eye) three times a day as directed.   Diclofenac Sodium (PENNSAID) 2 % SOLN, Place 2 g onto the skin 2 (two) times daily.   gabapentin (NEURONTIN) 100 MG capsule, TAKE 2 CAPSULES BY MOUTH AT BEDTIME   ketorolac (ACULAR) 0.5 % ophthalmic solution, Instill 1 drop into affected eye ( Left eye) twice a day as directed.   Melatonin 5 MG TABS, Take 5 mg by mouth at bedtime.   Multiple Vitamins-Minerals (CENTRUM SILVER PO), Take half (1/2) tablet by mouth every other day.   prednisoLONE acetate (PRED FORTE) 1 % ophthalmic suspension, Apply 1 drop into left eye three times a day as directed   prednisoLONE acetate (PRED FORTE) 1 % ophthalmic suspension, Apply 1 drop into affected eye (Left eye) three times a day as directed.   traZODone (DESYREL) 50 MG tablet, Take 50 mg by mouth at bedtime.   traZODone (DESYREL) 50 MG tablet, TAKE 1 TABLET BY MOUTH EACH NIGHT AT BEDTIME AS NEEDED   Reviewed prior external information including notes and imaging from  primary care provider As well as notes that were available from care everywhere and other healthcare systems.  Past medical history, social, surgical and family history all reviewed in electronic medical record.  No pertanent information unless stated regarding to the chief complaint.   Review of Systems:  No  headache, visual changes, nausea, vomiting, diarrhea, constipation, dizziness, abdominal pain, skin rash, fevers, chills, night sweats, weight loss, swollen lymph nodes, body aches, joint swelling, chest pain, shortness of breath, mood changes. POSITIVE muscle aches  Objective  There were no vitals taken for this visit.   General: No apparent distress alert and oriented x3 mood and affect normal, dressed appropriately.  HEENT: Pupils equal, extraocular movements intact  Respiratory: Patient's speak in full sentences and does not appear short of breath  Cardiovascular: No lower extremity edema, non tender, no erythema      Impression and Recommendations:

## 2022-02-13 ENCOUNTER — Ambulatory Visit: Payer: 59 | Admitting: Family Medicine

## 2022-02-20 ENCOUNTER — Other Ambulatory Visit (HOSPITAL_COMMUNITY): Payer: Self-pay

## 2022-02-26 ENCOUNTER — Other Ambulatory Visit (HOSPITAL_COMMUNITY): Payer: Self-pay

## 2022-02-26 DIAGNOSIS — H1033 Unspecified acute conjunctivitis, bilateral: Secondary | ICD-10-CM | POA: Diagnosis not present

## 2022-02-26 MED ORDER — LOTEMAX SM 0.38 % OP GEL
OPHTHALMIC | 0 refills | Status: AC
  Filled 2022-02-26: qty 5, 15d supply, fill #0

## 2022-02-27 ENCOUNTER — Encounter: Payer: Self-pay | Admitting: Sports Medicine

## 2022-02-27 ENCOUNTER — Other Ambulatory Visit (HOSPITAL_COMMUNITY): Payer: Self-pay

## 2022-03-08 ENCOUNTER — Other Ambulatory Visit (HOSPITAL_COMMUNITY): Payer: Self-pay

## 2022-03-12 ENCOUNTER — Other Ambulatory Visit (HOSPITAL_COMMUNITY): Payer: Self-pay

## 2022-03-26 ENCOUNTER — Other Ambulatory Visit (HOSPITAL_COMMUNITY): Payer: Self-pay

## 2022-03-26 MED ORDER — EZETIMIBE 10 MG PO TABS
10.0000 mg | ORAL_TABLET | Freq: Every day | ORAL | 4 refills | Status: AC
  Filled 2022-03-26: qty 90, 90d supply, fill #0
  Filled 2022-06-17: qty 90, 90d supply, fill #1

## 2022-03-27 ENCOUNTER — Other Ambulatory Visit (HOSPITAL_COMMUNITY): Payer: Self-pay

## 2022-04-05 ENCOUNTER — Other Ambulatory Visit (HOSPITAL_COMMUNITY): Payer: Self-pay

## 2022-04-08 ENCOUNTER — Other Ambulatory Visit (HOSPITAL_COMMUNITY): Payer: Self-pay

## 2022-04-09 ENCOUNTER — Other Ambulatory Visit (HOSPITAL_COMMUNITY): Payer: Self-pay

## 2022-04-23 ENCOUNTER — Other Ambulatory Visit (HOSPITAL_COMMUNITY): Payer: Self-pay

## 2022-04-23 MED ORDER — TRAZODONE HCL 50 MG PO TABS
50.0000 mg | ORAL_TABLET | Freq: Every evening | ORAL | 3 refills | Status: AC | PRN
  Filled 2022-04-23 – 2022-07-18 (×2): qty 90, 90d supply, fill #0
  Filled 2022-10-08: qty 90, 90d supply, fill #1

## 2022-04-23 MED ORDER — LEVOTHYROXINE SODIUM 88 MCG PO TABS
ORAL_TABLET | ORAL | 3 refills | Status: DC
  Filled 2022-08-16: qty 102, 90d supply, fill #0
  Filled 2022-11-06: qty 102, 90d supply, fill #1
  Filled 2023-04-11: qty 102, 90d supply, fill #2

## 2022-04-23 MED ORDER — TRAZODONE HCL 50 MG PO TABS
50.0000 mg | ORAL_TABLET | Freq: Every evening | ORAL | 3 refills | Status: DC | PRN
  Filled 2022-04-23: qty 90, 90d supply, fill #0
  Filled 2022-07-16 – 2023-04-11 (×2): qty 90, 90d supply, fill #1

## 2022-04-23 MED ORDER — LOSARTAN POTASSIUM-HCTZ 50-12.5 MG PO TABS
1.0000 | ORAL_TABLET | Freq: Every day | ORAL | 3 refills | Status: DC
  Filled 2022-04-23 – 2022-07-18 (×2): qty 90, 90d supply, fill #0
  Filled 2022-10-08: qty 90, 90d supply, fill #1
  Filled 2023-04-11: qty 90, 90d supply, fill #2

## 2022-04-23 MED ORDER — LOSARTAN POTASSIUM-HCTZ 50-12.5 MG PO TABS
1.0000 | ORAL_TABLET | Freq: Every day | ORAL | 3 refills | Status: AC
  Filled 2022-04-23: qty 90, 90d supply, fill #0
  Filled 2022-07-16: qty 90, 90d supply, fill #1

## 2022-05-17 ENCOUNTER — Other Ambulatory Visit (HOSPITAL_COMMUNITY): Payer: Self-pay

## 2022-05-20 ENCOUNTER — Other Ambulatory Visit (HOSPITAL_COMMUNITY): Payer: Self-pay

## 2022-05-22 ENCOUNTER — Other Ambulatory Visit (HOSPITAL_COMMUNITY): Payer: Self-pay

## 2022-05-23 ENCOUNTER — Other Ambulatory Visit (HOSPITAL_COMMUNITY): Payer: Self-pay

## 2022-05-24 ENCOUNTER — Other Ambulatory Visit (HOSPITAL_COMMUNITY): Payer: Self-pay

## 2022-05-28 ENCOUNTER — Other Ambulatory Visit (HOSPITAL_COMMUNITY): Payer: Self-pay

## 2022-06-17 ENCOUNTER — Other Ambulatory Visit (HOSPITAL_COMMUNITY): Payer: Self-pay

## 2022-06-17 MED ORDER — SIMVASTATIN 40 MG PO TABS
40.0000 mg | ORAL_TABLET | Freq: Every day | ORAL | 4 refills | Status: AC
  Filled 2022-06-17 – 2022-06-18 (×2): qty 90, 90d supply, fill #0

## 2022-06-17 MED ORDER — SIMVASTATIN 40 MG PO TABS
40.0000 mg | ORAL_TABLET | Freq: Every day | ORAL | 4 refills | Status: DC
  Filled 2022-06-17 – 2022-09-10 (×5): qty 90, 90d supply, fill #0
  Filled 2022-12-03 – 2022-12-23 (×2): qty 90, 90d supply, fill #1
  Filled 2023-03-17: qty 90, 90d supply, fill #2
  Filled ????-??-??: fill #0

## 2022-06-17 MED ORDER — EZETIMIBE 10 MG PO TABS
10.0000 mg | ORAL_TABLET | Freq: Every day | ORAL | 4 refills | Status: DC
  Filled 2022-06-17 – 2022-09-10 (×5): qty 90, 90d supply, fill #0
  Filled 2022-12-03 – 2022-12-23 (×2): qty 90, 90d supply, fill #1
  Filled 2023-03-17: qty 90, 90d supply, fill #2
  Filled ????-??-??: fill #0

## 2022-06-17 MED ORDER — TESTOSTERONE 50 MG/5GM (1%) TD GEL
TRANSDERMAL | 3 refills | Status: AC
  Filled 2022-06-19: qty 150, 30d supply, fill #0
  Filled 2022-07-16 (×5): qty 150, 30d supply, fill #1
  Filled 2022-08-16 – 2022-09-03 (×3): qty 150, 30d supply, fill #2
  Filled 2022-09-30 – 2022-10-08 (×2): qty 150, 30d supply, fill #3

## 2022-06-18 ENCOUNTER — Other Ambulatory Visit (HOSPITAL_COMMUNITY): Payer: Self-pay

## 2022-06-19 ENCOUNTER — Other Ambulatory Visit (HOSPITAL_COMMUNITY): Payer: Self-pay

## 2022-06-20 ENCOUNTER — Other Ambulatory Visit (HOSPITAL_COMMUNITY): Payer: Self-pay

## 2022-06-24 ENCOUNTER — Other Ambulatory Visit (HOSPITAL_COMMUNITY): Payer: Self-pay

## 2022-06-26 ENCOUNTER — Other Ambulatory Visit (HOSPITAL_COMMUNITY): Payer: Self-pay

## 2022-07-16 ENCOUNTER — Other Ambulatory Visit (HOSPITAL_COMMUNITY): Payer: Self-pay

## 2022-07-16 ENCOUNTER — Other Ambulatory Visit: Payer: Self-pay

## 2022-07-17 ENCOUNTER — Other Ambulatory Visit: Payer: Self-pay

## 2022-07-17 ENCOUNTER — Other Ambulatory Visit (HOSPITAL_COMMUNITY): Payer: Self-pay

## 2022-07-18 ENCOUNTER — Other Ambulatory Visit: Payer: Self-pay

## 2022-07-19 ENCOUNTER — Other Ambulatory Visit: Payer: Self-pay

## 2022-07-24 ENCOUNTER — Other Ambulatory Visit (HOSPITAL_COMMUNITY): Payer: Self-pay

## 2022-07-25 DIAGNOSIS — R7301 Impaired fasting glucose: Secondary | ICD-10-CM | POA: Diagnosis not present

## 2022-07-25 DIAGNOSIS — E291 Testicular hypofunction: Secondary | ICD-10-CM | POA: Diagnosis not present

## 2022-07-25 DIAGNOSIS — R7989 Other specified abnormal findings of blood chemistry: Secondary | ICD-10-CM | POA: Diagnosis not present

## 2022-07-25 DIAGNOSIS — E039 Hypothyroidism, unspecified: Secondary | ICD-10-CM | POA: Diagnosis not present

## 2022-07-25 DIAGNOSIS — E785 Hyperlipidemia, unspecified: Secondary | ICD-10-CM | POA: Diagnosis not present

## 2022-07-25 DIAGNOSIS — E559 Vitamin D deficiency, unspecified: Secondary | ICD-10-CM | POA: Diagnosis not present

## 2022-07-25 DIAGNOSIS — Z125 Encounter for screening for malignant neoplasm of prostate: Secondary | ICD-10-CM | POA: Diagnosis not present

## 2022-07-25 DIAGNOSIS — Z1212 Encounter for screening for malignant neoplasm of rectum: Secondary | ICD-10-CM | POA: Diagnosis not present

## 2022-07-30 DIAGNOSIS — R82998 Other abnormal findings in urine: Secondary | ICD-10-CM | POA: Diagnosis not present

## 2022-08-16 ENCOUNTER — Other Ambulatory Visit (HOSPITAL_COMMUNITY): Payer: Self-pay

## 2022-08-21 ENCOUNTER — Other Ambulatory Visit (HOSPITAL_COMMUNITY): Payer: Self-pay

## 2022-08-26 ENCOUNTER — Other Ambulatory Visit: Payer: Self-pay

## 2022-08-28 ENCOUNTER — Other Ambulatory Visit (HOSPITAL_COMMUNITY): Payer: Self-pay

## 2022-08-30 ENCOUNTER — Other Ambulatory Visit (HOSPITAL_COMMUNITY): Payer: Self-pay

## 2022-08-30 MED ORDER — TESTOSTERONE 50 MG/5GM (1%) TD GEL
5.0000 g | Freq: Every day | TRANSDERMAL | 3 refills | Status: DC
  Filled 2022-08-30 – 2022-11-04 (×4): qty 150, 30d supply, fill #0
  Filled 2022-12-23: qty 150, 30d supply, fill #1
  Filled 2023-01-20: qty 150, 30d supply, fill #2

## 2022-09-03 ENCOUNTER — Other Ambulatory Visit (HOSPITAL_COMMUNITY): Payer: Self-pay

## 2022-09-03 ENCOUNTER — Other Ambulatory Visit: Payer: Self-pay

## 2022-09-04 ENCOUNTER — Other Ambulatory Visit: Payer: Self-pay

## 2022-09-05 ENCOUNTER — Other Ambulatory Visit: Payer: Self-pay

## 2022-09-05 ENCOUNTER — Other Ambulatory Visit (HOSPITAL_COMMUNITY): Payer: Self-pay

## 2022-09-06 ENCOUNTER — Other Ambulatory Visit: Payer: Self-pay

## 2022-09-09 ENCOUNTER — Other Ambulatory Visit (HOSPITAL_COMMUNITY): Payer: Self-pay

## 2022-09-10 ENCOUNTER — Other Ambulatory Visit: Payer: Self-pay

## 2022-09-12 DIAGNOSIS — H57813 Brow ptosis, bilateral: Secondary | ICD-10-CM | POA: Diagnosis not present

## 2022-09-12 DIAGNOSIS — H02834 Dermatochalasis of left upper eyelid: Secondary | ICD-10-CM | POA: Diagnosis not present

## 2022-09-12 DIAGNOSIS — H02413 Mechanical ptosis of bilateral eyelids: Secondary | ICD-10-CM | POA: Diagnosis not present

## 2022-09-12 DIAGNOSIS — H53483 Generalized contraction of visual field, bilateral: Secondary | ICD-10-CM | POA: Diagnosis not present

## 2022-09-12 DIAGNOSIS — H0279 Other degenerative disorders of eyelid and periocular area: Secondary | ICD-10-CM | POA: Diagnosis not present

## 2022-09-12 DIAGNOSIS — H02831 Dermatochalasis of right upper eyelid: Secondary | ICD-10-CM | POA: Diagnosis not present

## 2022-09-17 ENCOUNTER — Ambulatory Visit (INDEPENDENT_AMBULATORY_CARE_PROVIDER_SITE_OTHER): Payer: Commercial Managed Care - PPO | Admitting: Sports Medicine

## 2022-09-17 VITALS — BP 128/78 | Ht 69.0 in | Wt 179.0 lb

## 2022-09-17 DIAGNOSIS — M2142 Flat foot [pes planus] (acquired), left foot: Secondary | ICD-10-CM | POA: Diagnosis not present

## 2022-09-17 DIAGNOSIS — M2141 Flat foot [pes planus] (acquired), right foot: Secondary | ICD-10-CM

## 2022-09-17 NOTE — Progress Notes (Signed)
Patient ID: Kirk [name], male   DOB: [birth year], 66 y.o.   MRN: [id number]  Kirk [name] presents today to discuss new custom orthotics.  We previously made him a pair in July 2023.  He feels like they may be losing their cushion.  Inspection of his current orthotics shows the base to be in excellent shape.  The metatarsal pads and lateral heel wedges are compressed.  I discussed simply replacing the metatarsal pads and lateral heel wedges first before making a new pair of orthotics.  Since it has been less than a year, there is a good chance that his insurance will not cover a new pair of orthotics. If changing the pads does not make any difference then he may need to return sooner than July.

## 2022-09-23 DIAGNOSIS — H0279 Other degenerative disorders of eyelid and periocular area: Secondary | ICD-10-CM | POA: Diagnosis not present

## 2022-09-23 DIAGNOSIS — H02834 Dermatochalasis of left upper eyelid: Secondary | ICD-10-CM | POA: Diagnosis not present

## 2022-09-23 DIAGNOSIS — H02413 Mechanical ptosis of bilateral eyelids: Secondary | ICD-10-CM | POA: Diagnosis not present

## 2022-09-23 DIAGNOSIS — H02422 Myogenic ptosis of left eyelid: Secondary | ICD-10-CM | POA: Diagnosis not present

## 2022-09-23 DIAGNOSIS — H02421 Myogenic ptosis of right eyelid: Secondary | ICD-10-CM | POA: Diagnosis not present

## 2022-09-23 DIAGNOSIS — H02411 Mechanical ptosis of right eyelid: Secondary | ICD-10-CM | POA: Diagnosis not present

## 2022-09-23 DIAGNOSIS — H57813 Brow ptosis, bilateral: Secondary | ICD-10-CM | POA: Diagnosis not present

## 2022-09-23 DIAGNOSIS — H02831 Dermatochalasis of right upper eyelid: Secondary | ICD-10-CM | POA: Diagnosis not present

## 2022-09-23 DIAGNOSIS — H53483 Generalized contraction of visual field, bilateral: Secondary | ICD-10-CM | POA: Diagnosis not present

## 2022-09-23 DIAGNOSIS — H02412 Mechanical ptosis of left eyelid: Secondary | ICD-10-CM | POA: Diagnosis not present

## 2022-09-23 DIAGNOSIS — H02423 Myogenic ptosis of bilateral eyelids: Secondary | ICD-10-CM | POA: Diagnosis not present

## 2022-09-30 ENCOUNTER — Other Ambulatory Visit (HOSPITAL_COMMUNITY): Payer: Self-pay

## 2022-10-02 NOTE — Progress Notes (Unsigned)
[place] Phone: [phone] Subjective:   Kirk [name], am serving as a scribe for Dr. Hulan [name].  I'm seeing this patient by the request  of:  Kirk Bowen, MD  CC: Bilateral foot pain  [id number]  Kirk [name] is a 66 y.o. male coming in with complaint of L foot pain. Last seen in May 2023. Patient states that he continues to have sharp pain between 4th and 5th metacarpal. Dull pain over lateral aspect of 5th metacarpal. Pain seems to be worsening when walking longer distances. Occasionally will have pain between 2nd and 3rd metacarpal. C/o generalized achiness in all of the metatarsal heads. Patient is using orthotics and shoes with zero drop. Had metatarsal cookies replaced recently on his orthotics and this has not helped.       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[facility])    Past Surgical History:  Procedure Laterality Date   DENTAL SURGERY     TONSILLECTOMY     Social History   Socioeconomic History   Marital status: Married    Spouse name: Not on file   Number of children: Not on file   Years of education: Not on file   Highest education level: Not on file  Occupational History   Not on file  Tobacco Use   Smoking status: Never   Smokeless tobacco: Never  Substance and Sexual Activity   Alcohol use: Yes   Drug use: [name]   Sexual activity: Not on file  Other Topics Concern   Not on file  Social History Narrative   Not on file   Social Determinants of Health   Financial Resource Strain: Not on file  Food Insecurity: Not on file  Transportation Needs: Not on file  Physical Activity: Not on file  Stress: Not on file  Social Connections: Not on file   [name] Known Allergies Family History  Problem Relation Age of Onset   Congestive Heart Failure Father    Heart disease Father    Parkinson's disease Mother    Stroke Mother      Current Outpatient Medications (Endocrine & Metabolic):    levothyroxine (SYNTHROID) 88 MCG tablet, TAKE 1 TABLET BY MOUTH ONCE DAILY AND TAKE 1 EXTRA TABLET ON SUNDAYS   levothyroxine (SYNTHROID) 88 MCG tablet, Take 1 tablet (88 mcg total) by mouth daily AND take 2 tablets (176 mcg total) on Sunday.   levothyroxine (SYNTHROID, LEVOTHROID) 88 MCG tablet, Take 88 mcg by mouth daily before breakfast.   testosterone (ANDROGEL) 50 MG/5GM (1%) GEL, Apply 5 grams onto the skin daily.   testosterone (ANDROGEL) 50 MG/5GM (1%) GEL, APPLY 1 PACKET TO SKIN ONCE DAILY AS DIRECTED   testosterone (ANDROGEL) 50 MG/5GM (1%) GEL, APPLY 1 PACKET TO SKIN ONCE DAILY AS DIRECTED   testosterone (ANDROGEL) 50 MG/5GM (1%) GEL, APPLY 1 PACKET TO SKIN ONCE DAILY AS DIRECTED   testosterone (ANDROGEL) 50 MG/5GM (1%) GEL, Place one packet (5 g) onto the skin daily as directed   levothyroxine (SYNTHROID) 88 MCG tablet, TAKE 1 TABLET BY MOUTH ONCE DAILY AND 1 EXTRA TABLET ON SUNDAYS   testosterone (ANDROGEL) 50 MG/5GM (1%) GEL, APPLY 1 PACKET TO SKIN ONCE DAILY AS DIRECTED   testosterone (ANDROGEL) 50 MG/5GM (1%) GEL, APPLY 1 PACKET TO SKIN ONCE DAILY AS DIRECTED  Current Outpatient Medications (Cardiovascular):    amLODipine (NORVASC) 5 MG tablet, Take 1  tablet (5 mg total) by mouth daily.   ezetimibe (ZETIA) 10 MG tablet, Take 10 mg by mouth daily.   ezetimibe (ZETIA) 10 MG tablet, TAKE 1 TABLET BY MOUTH ONCE DAILY   ezetimibe (ZETIA) 10 MG tablet, TAKE 1 TABLET BY MOUTH ONCE DAILY   ezetimibe (ZETIA) 10 MG tablet, Take 1 tablet (10 mg total) by mouth daily.   losartan-hydrochlorothiazide (HYZAAR) 100-25 MG tablet, Take 0.5 tablets by mouth daily. Please make overdue appt with Kirk [name] before anymore refills. 2nd attempt   losartan-hydrochlorothiazide (HYZAAR) 50-12.5 MG tablet, Take 1 tablet by mouth daily.   losartan-hydrochlorothiazide (HYZAAR) 50-12.5 MG tablet, Take 1 tablet by mouth daily.   simvastatin  (ZOCOR) 40 MG tablet, Take 40 mg by mouth daily.   simvastatin (ZOCOR) 40 MG tablet, Take 1 tablet (40 mg total) by mouth daily.   simvastatin (ZOCOR) 40 MG tablet, Take 1 tablet (40 mg total) by mouth daily.   losartan-hydrochlorothiazide (HYZAAR) 50-12.5 MG tablet, TAKE 1 TABLET BY MOUTH DAILY   losartan-hydrochlorothiazide (HYZAAR) 50-12.5 MG tablet, TAKE 1 TABLET BY MOUTH DAILY   Current Outpatient Medications (Analgesics):    aspirin EC 81 MG tablet, Take 81 mg by mouth daily.   Naproxen Sodium 220 MG CAPS, Take 220 mg by mouth 2 (two) times daily as needed (pain).   Current Outpatient Medications (Other):    Besifloxacin HCl (BESIVANCE) 0.6 % SUSP, Instill 1 drop into affected eye (Left eye) three times a day as directed.   Diclofenac Sodium (PENNSAID) 2 % SOLN, Place 2 g onto the skin 2 (two) times daily.   ketorolac (ACULAR) 0.5 % ophthalmic solution, Instill 1 drop into affected eye ( Left eye) twice a day as directed.   Loteprednol Etabonate (LOTEMAX SM) 0.38 % GEL, Instill 1 drop into both eyes three times a day   Melatonin 5 MG TABS, Take 5 mg by mouth at bedtime.   Multiple Vitamins-Minerals (CENTRUM SILVER PO), Take half (1/2) tablet by mouth every other day.   prednisoLONE acetate (PRED FORTE) 1 % ophthalmic suspension, Apply 1 drop into left eye three times a day as directed   prednisoLONE acetate (PRED FORTE) 1 % ophthalmic suspension, Apply 1 drop into affected eye (Left eye) three times a day as directed.   traZODone (DESYREL) 50 MG tablet, Take 50 mg by mouth at bedtime.   traZODone (DESYREL) 50 MG tablet, Take 1 tablet (50 mg total) by mouth at bedtime as needed.   traZODone (DESYREL) 50 MG tablet, Take 1 tablet (50 mg total) by mouth at bedtime as needed.   gabapentin (NEURONTIN) 100 MG capsule, TAKE 2 CAPSULES BY MOUTH AT BEDTIME   Reviewed prior external information including notes and imaging from  primary care provider As well as notes that were available from  care everywhere and other healthcare systems.  Past medical history, social, surgical and family history all reviewed in electronic medical record.  [name] pertanent information unless stated regarding to the chief complaint.   Review of Systems:  [name] headache, visual changes, nausea, vomiting, diarrhea, constipation, dizziness, abdominal pain, skin rash, fevers, chills, night sweats, weight loss, swollen lymph nodes, body aches, joint swelling, chest pain, shortness of breath, mood changes. POSITIVE muscle aches  Objective  Blood pressure 128/86, pulse 67, height '5\' 9"'$  (1.753 m), weight 186 lb (84.4 kg), SpO2 97 %.   General: [name] apparent distress alert and oriented x3 mood and affect normal, dressed appropriately.  HEENT: Pupils equal, extraocular movements intact  Respiratory: Patient's  speak in full sentences and does not appear short of breath  Cardiovascular: [name] lower extremity edema, non tender, [name] erythema  Foot exam shows breakdown of the transverse arch bilaterally.  Positive squeeze test noted.  Procedure: Real-time Ultrasound Guided Injection of left foot neuroma Device: GE Logiq Q7 Ultrasound guided injection is preferred based studies that show increased duration, increased effect, greater accuracy, decreased procedural pain, increased response rate, and decreased cost with ultrasound guided versus blind injection.  Verbal informed consent obtained.  Time-out conducted.  Noted [name] overlying erythema, induration, or other signs of local infection.  Skin prepped in a sterile fashion.  Local anesthesia: Topical Ethyl chloride.  With sterile technique and under real time ultrasound guidance: With a 25-gauge half inch needle injected with 0.5 cc of 0.5% Marcaine and 0.5 cc of Kenalog 40 mg per/mL Completed without difficulty  Pain immediately resolved suggesting accurate placement of the medication.  Advised to call if fevers/chills, erythema, induration, drainage, or persistent  bleeding.  Impression: Technically successful ultrasound guided injection.   Procedure: Real-time Ultrasound Guided Injection of right foot neuroma  Device: GE Logiq Q7 Ultrasound guided injection is preferred based studies that show increased duration, increased effect, greater accuracy, decreased procedural pain, increased response rate, and decreased cost with ultrasound guided versus blind injection.  Verbal informed consent obtained.  Time-out conducted.  Noted [name] overlying erythema, induration, or other signs of local infection.  Skin prepped in a sterile fashion.  Local anesthesia: Topical Ethyl chloride.  With sterile technique and under real time ultrasound guidance: With a 25-gauge half inch needle injected with 0.5 cc of 0.5% Marcaine and 0.5 cc of Kenalog 40 mg/mL into the neural sheath Completed without difficulty  Pain immediately resolved suggesting accurate placement of the medication.  Advised to call if fevers/chills, erythema, induration, drainage, or persistent bleeding.  Impression: Technically successful ultrasound guided injection.   Impression and Recommendations:    The above documentation has been reviewed and is accurate and complete Lyndal Pulley, DO

## 2022-10-03 ENCOUNTER — Ambulatory Visit: Payer: Commercial Managed Care - PPO | Admitting: Family Medicine

## 2022-10-03 ENCOUNTER — Other Ambulatory Visit: Payer: Self-pay

## 2022-10-03 ENCOUNTER — Encounter: Payer: Self-pay | Admitting: Family Medicine

## 2022-10-03 VITALS — BP 128/86 | HR 67 | Ht 69.0 in | Wt 186.0 lb

## 2022-10-03 DIAGNOSIS — G5781 Other specified mononeuropathies of right lower limb: Secondary | ICD-10-CM | POA: Diagnosis not present

## 2022-10-03 DIAGNOSIS — G5782 Other specified mononeuropathies of left lower limb: Secondary | ICD-10-CM | POA: Diagnosis not present

## 2022-10-03 DIAGNOSIS — M79672 Pain in left foot: Secondary | ICD-10-CM

## 2022-10-03 NOTE — Patient Instructions (Signed)
Good to see you.  Ice 20 minutes 2 times daily. Usually after activity and before bed. Recovery HOKA shoes in the house.  Keep up the orthotics  See me again in 6-8 weeks

## 2022-10-03 NOTE — Assessment & Plan Note (Signed)
Now having pain on the contralateral foot at the moment.  Discussed icing regimen and home exercises, discussed which activities to do and which ones to avoid, increase activity slowly over the course of next several weeks.  Discussed icing regimen.  Follow-up again in 6 to 8 weeks

## 2022-10-03 NOTE — Assessment & Plan Note (Signed)
Responded well to the injection, tolerated the procedure well, discussed with patient about continuing the metatarsal pads, if continues to have difficulty related to consider medication such as gabapentin.  Follow-up again in 6 to 8 weeks

## 2022-10-08 ENCOUNTER — Other Ambulatory Visit: Payer: Self-pay

## 2022-10-08 ENCOUNTER — Other Ambulatory Visit (HOSPITAL_COMMUNITY): Payer: Self-pay

## 2022-10-30 ENCOUNTER — Ambulatory Visit: Payer: Commercial Managed Care - PPO | Admitting: Family Medicine

## 2022-11-04 ENCOUNTER — Other Ambulatory Visit: Payer: Self-pay

## 2022-11-04 ENCOUNTER — Other Ambulatory Visit (HOSPITAL_COMMUNITY): Payer: Self-pay

## 2022-11-06 ENCOUNTER — Other Ambulatory Visit (HOSPITAL_COMMUNITY): Payer: Self-pay

## 2022-11-21 NOTE — Progress Notes (Signed)
[name] [address] [place] [address] Phone: [phone] Subjective:   Kirk [name], am serving as a scribe for Dr. Antoine Primas.  I'm seeing this patient by the request  of:  Adrian Prince, MD  CC: left shoulder pain   [id number]  10/03/2022 Now having pain on the contralateral foot at the moment.  Discussed icing regimen and home exercises, discussed which activities to do and which ones to avoid, increase activity slowly over the course of next several weeks.  Discussed icing regimen.  Follow-up again in 6 to 8 weeks     Responded well to the injection, tolerated the procedure well, discussed with patient about continuing the metatarsal pads, if continues to have difficulty related to consider medication such as gabapentin.  Follow-up again in 6 to 8 weeks      Update 11/22/2022 Kirk [name] is a 67 y.o. male coming in with complaint of L foot pain. Patient states that he is using OOFOS sandals and these are helping. Pain is still present when he is wearing any other shoe. Injection is wearing off and he notices a pinching sensation in the foot.      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[facility])    Past Surgical History:  Procedure Laterality Date   DENTAL SURGERY     TONSILLECTOMY     Social History   Socioeconomic History   Marital status: Married    Spouse name: Not on file   Number of children: Not on file   Years of education: Not on file   Highest education level: Not on file  Occupational History   Not on file  Tobacco Use   Smoking status: Never   Smokeless tobacco: Never  Substance and Sexual Activity   Alcohol use: Yes   Drug use: No   Sexual activity: Not on file  Other Topics Concern   Not on file  Social History Narrative   Not on file   Social Determinants of Health   Financial Resource Strain: Not on file  Food Insecurity: Not on file   Transportation Needs: Not on file  Physical Activity: Not on file  Stress: Not on file  Social Connections: Not on file   No Known Allergies Family History  Problem Relation Age of Onset   Congestive Heart Failure Father    Heart disease Father    Parkinson's disease Mother    Stroke Mother     Current Outpatient Medications (Endocrine & Metabolic):    levothyroxine (SYNTHROID) 88 MCG tablet, TAKE 1 TABLET BY MOUTH ONCE DAILY AND TAKE 1 EXTRA TABLET ON SUNDAYS   levothyroxine (SYNTHROID) 88 MCG tablet, Take 1 tablet (88 mcg total) by mouth daily AND take 2 tablets (176 mcg total) on Sunday.   levothyroxine (SYNTHROID, LEVOTHROID) 88 MCG tablet, Take 88 mcg by mouth daily before breakfast.   testosterone (ANDROGEL) 50 MG/5GM (1%) GEL, Apply 5 grams onto the skin daily.   testosterone (ANDROGEL) 50 MG/5GM (1%) GEL, APPLY 1 PACKET TO SKIN ONCE DAILY AS DIRECTED   testosterone (ANDROGEL) 50 MG/5GM (1%) GEL, APPLY 1 PACKET TO SKIN ONCE DAILY AS DIRECTED   testosterone (ANDROGEL) 50 MG/5GM (1%) GEL, APPLY 1 PACKET TO SKIN ONCE DAILY AS DIRECTED   testosterone (ANDROGEL) 50 MG/5GM (1%) GEL, Place one packet (5 g) onto the skin daily as directed   levothyroxine (SYNTHROID) 88 MCG tablet, TAKE 1 TABLET BY  MOUTH ONCE DAILY AND 1 EXTRA TABLET ON SUNDAYS   testosterone (ANDROGEL) 50 MG/5GM (1%) GEL, APPLY 1 PACKET TO SKIN ONCE DAILY AS DIRECTED   testosterone (ANDROGEL) 50 MG/5GM (1%) GEL, APPLY 1 PACKET TO SKIN ONCE DAILY AS DIRECTED  Current Outpatient Medications (Cardiovascular):    amLODipine (NORVASC) 5 MG tablet, Take 1 tablet (5 mg total) by mouth daily.   ezetimibe (ZETIA) 10 MG tablet, Take 10 mg by mouth daily.   ezetimibe (ZETIA) 10 MG tablet, TAKE 1 TABLET BY MOUTH ONCE DAILY   ezetimibe (ZETIA) 10 MG tablet, TAKE 1 TABLET BY MOUTH ONCE DAILY   ezetimibe (ZETIA) 10 MG tablet, Take 1 tablet (10 mg total) by mouth daily.   losartan-hydrochlorothiazide (HYZAAR) 100-25 MG tablet, Take  0.5 tablets by mouth daily. Please make overdue appt with Dr. Katrinka Blazing before anymore refills. 2nd attempt   losartan-hydrochlorothiazide (HYZAAR) 50-12.5 MG tablet, Take 1 tablet by mouth daily.   losartan-hydrochlorothiazide (HYZAAR) 50-12.5 MG tablet, Take 1 tablet by mouth daily.   simvastatin (ZOCOR) 40 MG tablet, Take 40 mg by mouth daily.   simvastatin (ZOCOR) 40 MG tablet, Take 1 tablet (40 mg total) by mouth daily.   simvastatin (ZOCOR) 40 MG tablet, Take 1 tablet (40 mg total) by mouth daily.   losartan-hydrochlorothiazide (HYZAAR) 50-12.5 MG tablet, TAKE 1 TABLET BY MOUTH DAILY   losartan-hydrochlorothiazide (HYZAAR) 50-12.5 MG tablet, TAKE 1 TABLET BY MOUTH DAILY   Current Outpatient Medications (Analgesics):    aspirin EC 81 MG tablet, Take 81 mg by mouth daily.   Naproxen Sodium 220 MG CAPS, Take 220 mg by mouth 2 (two) times daily as needed (pain).   Current Outpatient Medications (Other):    Besifloxacin HCl (BESIVANCE) 0.6 % SUSP, Instill 1 drop into affected eye (Left eye) three times a day as directed.   Diclofenac Sodium (PENNSAID) 2 % SOLN, Place 2 g onto the skin 2 (two) times daily.   ketorolac (ACULAR) 0.5 % ophthalmic solution, Instill 1 drop into affected eye ( Left eye) twice a day as directed.   Loteprednol Etabonate (LOTEMAX SM) 0.38 % GEL, Instill 1 drop into both eyes three times a day   Melatonin 5 MG TABS, Take 5 mg by mouth at bedtime.   Multiple Vitamins-Minerals (CENTRUM SILVER PO), Take half (1/2) tablet by mouth every other day.   prednisoLONE acetate (PRED FORTE) 1 % ophthalmic suspension, Apply 1 drop into left eye three times a day as directed   prednisoLONE acetate (PRED FORTE) 1 % ophthalmic suspension, Apply 1 drop into affected eye (Left eye) three times a day as directed.   traZODone (DESYREL) 50 MG tablet, Take 50 mg by mouth at bedtime.   traZODone (DESYREL) 50 MG tablet, Take 1 tablet (50 mg total) by mouth at bedtime as needed.   traZODone  (DESYREL) 50 MG tablet, Take 1 tablet (50 mg total) by mouth at bedtime as needed.   gabapentin (NEURONTIN) 100 MG capsule, TAKE 2 CAPSULES BY MOUTH AT BEDTIME   Reviewed prior external information including notes and imaging from  primary care provider As well as notes that were available from care everywhere and other healthcare systems.  Past medical history, social, surgical and family history all reviewed in electronic medical record.  No pertanent information unless stated regarding to the chief complaint.   Review of Systems:  No headache, visual changes, nausea, vomiting, diarrhea, constipation, dizziness, abdominal pain, skin rash, fevers, chills, night sweats, weight loss, swollen lymph nodes, body aches, joint  swelling, chest pain, shortness of breath, mood changes. POSITIVE muscle aches  Objective  Blood pressure 118/82, pulse 64, height 5\' 9"  (1.753 m), weight 182 lb (82.6 kg), SpO2 97 %.   General: No apparent distress alert and oriented x3 mood and affect normal, dressed appropriately.  HEENT: Pupils equal, extraocular movements intact  Respiratory: Patient's speak in full sentences and does not appear short of breath  Cardiovascular: No lower extremity edema, non tender, no erythema  Foot exam doe shave breakdown of the transverse arch noted.  Patient does have some tenderness to palpation diffusely of the foot.  Seems to be more on the dorsal aspect of the foot.  Procedure: Real-time Ultrasound Guided Injection of left neuroma  Device: GE Logiq Q7 Ultrasound guided injection is preferred based studies that show increased duration, increased effect, greater accuracy, decreased procedural pain, increased response rate, and decreased cost with ultrasound guided versus blind injection.  Verbal informed consent obtained.  Time-out conducted.  Noted no overlying erythema, induration, or other signs of local infection.  Skin prepped in a sterile fashion.  Local anesthesia:  Topical Ethyl chloride.  With sterile technique and under real time ultrasound guidance: With a 25-gauge half inch needle injected with 0.5 cc of 0.5% Marcaine and 0.5 cc of Kenalog 40 mg/mL Completed without difficulty  Pain immediately resolved suggesting accurate placement of the medication.  Advised to call if fevers/chills, erythema, induration, drainage, or persistent bleeding.  Impression: Technically successful ultrasound guided injection.    Impression and Recommendations:    The above documentation has been reviewed and is accurate and complete Judi Saa, DO

## 2022-11-25 ENCOUNTER — Ambulatory Visit: Payer: Commercial Managed Care - PPO | Admitting: Family Medicine

## 2022-11-25 ENCOUNTER — Encounter: Payer: Self-pay | Admitting: Family Medicine

## 2022-11-25 ENCOUNTER — Other Ambulatory Visit: Payer: Self-pay

## 2022-11-25 VITALS — BP 118/82 | HR 64 | Ht 69.0 in | Wt 182.0 lb

## 2022-11-25 DIAGNOSIS — G5782 Other specified mononeuropathies of left lower limb: Secondary | ICD-10-CM | POA: Diagnosis not present

## 2022-11-25 DIAGNOSIS — M79672 Pain in left foot: Secondary | ICD-10-CM | POA: Diagnosis not present

## 2022-11-25 NOTE — Patient Instructions (Signed)
Injected foot today See me again in 2 months

## 2022-11-25 NOTE — Assessment & Plan Note (Signed)
Chronic problem with worsening symptoms.  Getting a repeat injection again today.  Tolerated the procedure well.  Discussed with patient about discontinuing and need to consider the possibility of PRP if this continues.  Discussed which activities to do and which ones to avoid.  Increase activity slowly.  Follow-up again in 6 to 8 weeks

## 2022-12-03 ENCOUNTER — Other Ambulatory Visit (HOSPITAL_COMMUNITY): Payer: Self-pay

## 2022-12-03 ENCOUNTER — Encounter (HOSPITAL_COMMUNITY): Payer: Self-pay

## 2022-12-03 ENCOUNTER — Encounter: Payer: Self-pay | Admitting: Family Medicine

## 2022-12-09 ENCOUNTER — Other Ambulatory Visit: Payer: Self-pay

## 2022-12-23 ENCOUNTER — Other Ambulatory Visit (HOSPITAL_COMMUNITY): Payer: Self-pay

## 2022-12-23 ENCOUNTER — Other Ambulatory Visit: Payer: Self-pay

## 2022-12-24 ENCOUNTER — Other Ambulatory Visit: Payer: Self-pay

## 2022-12-25 ENCOUNTER — Other Ambulatory Visit: Payer: Self-pay

## 2022-12-25 ENCOUNTER — Other Ambulatory Visit (HOSPITAL_COMMUNITY): Payer: Self-pay

## 2022-12-26 ENCOUNTER — Other Ambulatory Visit: Payer: Self-pay

## 2023-01-13 IMAGING — DX DG CERVICAL SPINE 2 OR 3 VIEWS
3 series · 3 of 3 positions shown · non-contrast
Comparison: None.

CLINICAL DATA: Cervical spine pain

EXAM:
CERVICAL SPINE - 2-3 VIEW

[c-spine lat]
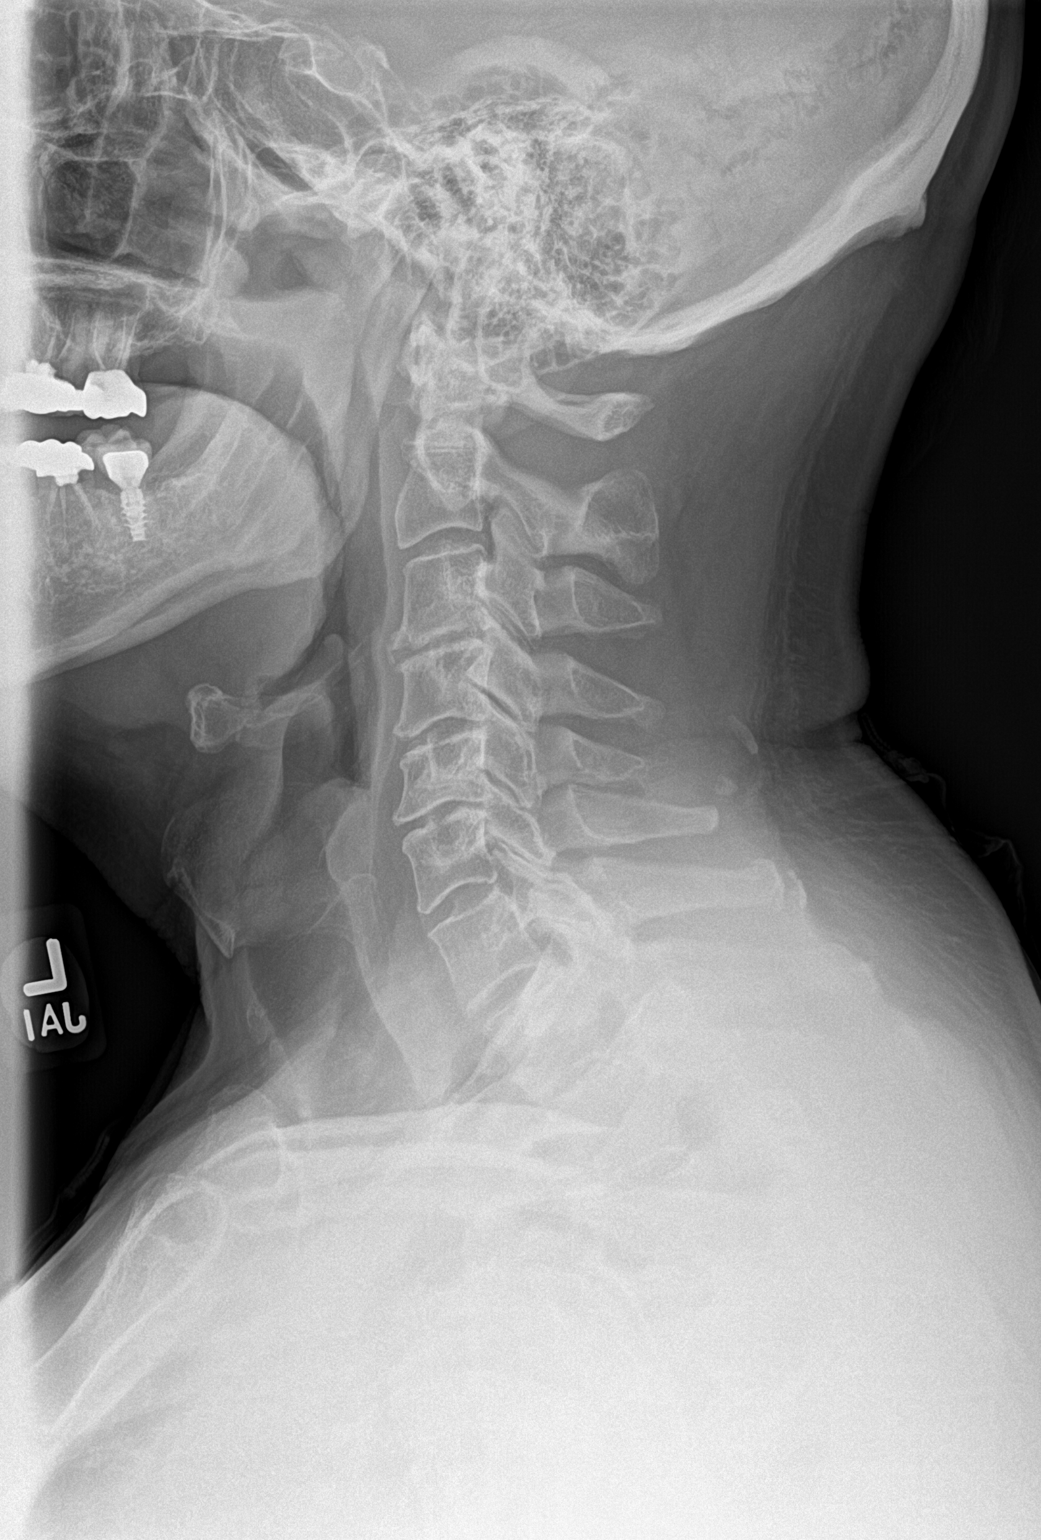

[c-spine ap]
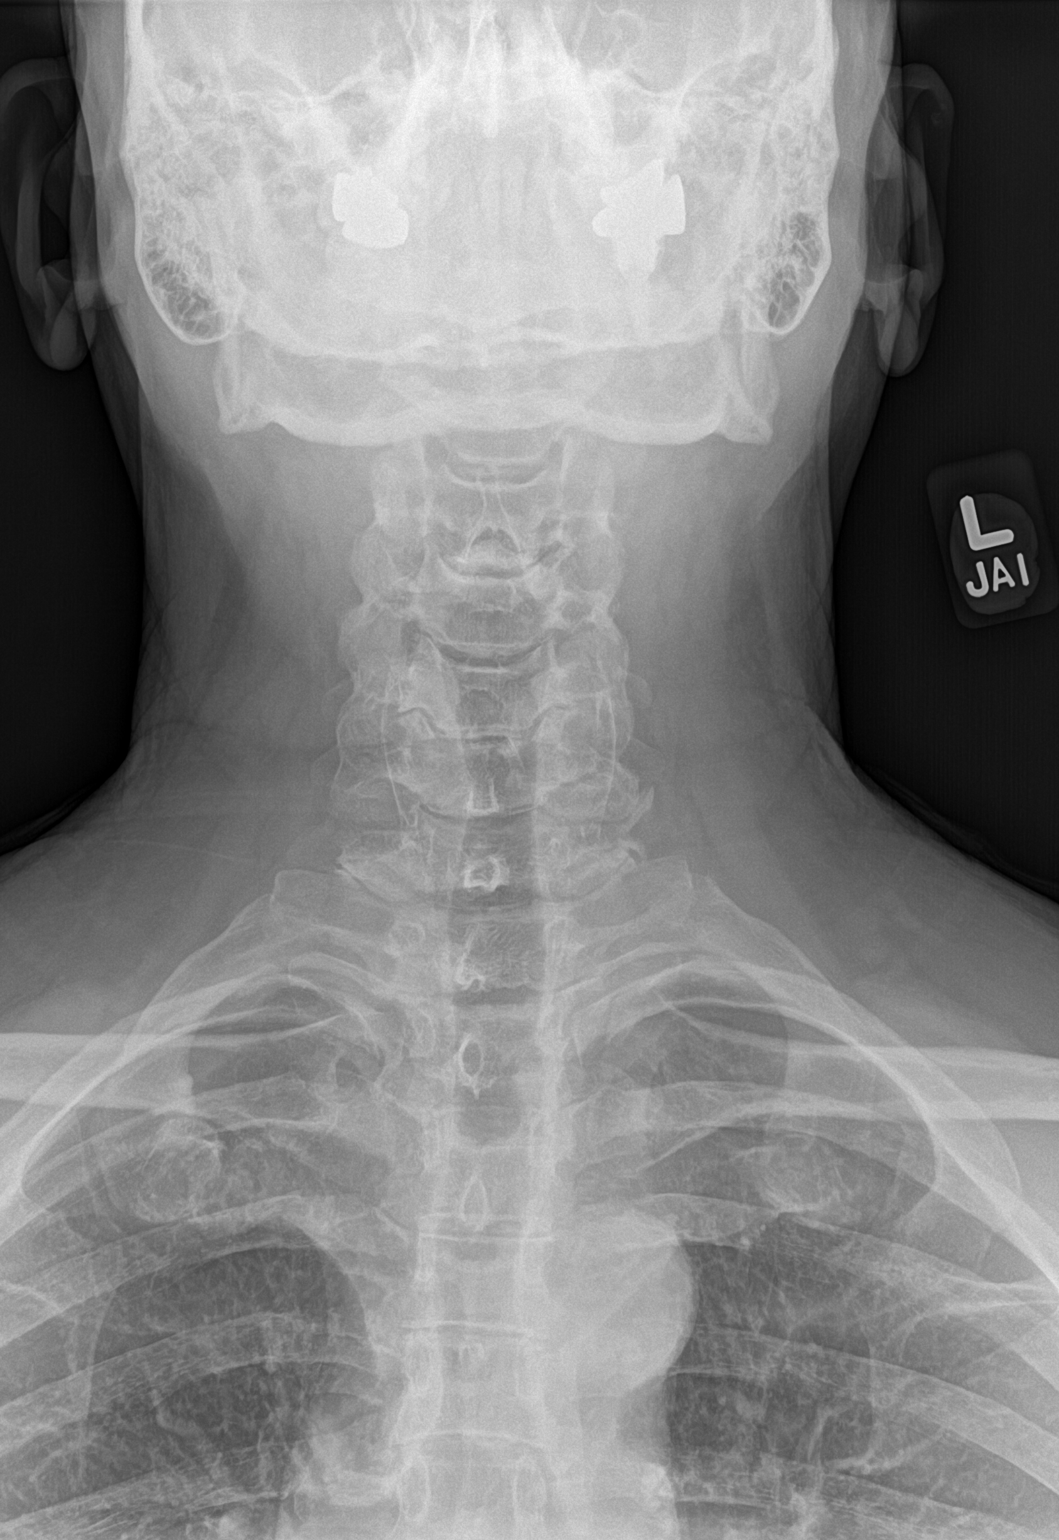

[c-spine open mouth]
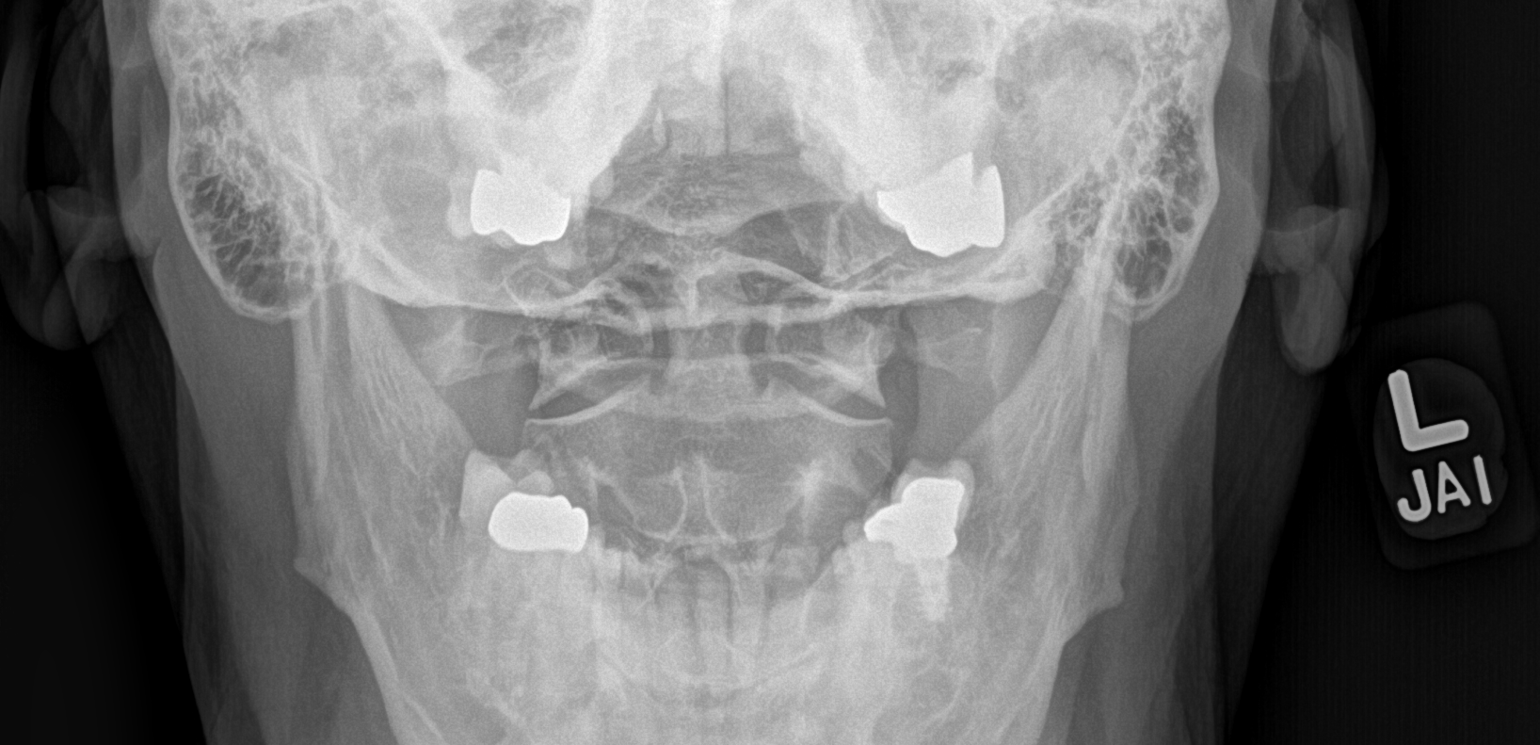

[3 of 3 positions shown; findings below may reference images not displayed]

FINDINGS: There is no evidence of cervical spine fracture or prevertebral soft
tissue swelling. Alignment is normal. Mild multilevel degenerative
disc disease, most pronounced at C3-C4 and C6-C7 where there is mild
disc space height loss and endplate changes. Mild multilevel facet
arthropathy which is most pronounced in the lower cervical spine.
Dens is intact on open-mouth view. Soft tissues soft tissues are
unremarkable.
IMPRESSION: Mild multilevel degenerative changes.

## 2023-01-16 ENCOUNTER — Other Ambulatory Visit (HOSPITAL_COMMUNITY): Payer: Self-pay

## 2023-01-20 ENCOUNTER — Other Ambulatory Visit: Payer: Self-pay

## 2023-01-27 NOTE — Progress Notes (Deleted)
[name] [address] [place] [address] Phone: [phone] Subjective:    I'm seeing this patient by the request  of:  Kirk Prince, MD  CC:   [id number]  11/25/2022 Chronic problem with worsening symptoms.  Getting a repeat injection again today.  Tolerated the procedure well.  Discussed with patient about discontinuing and need to consider the possibility of PRP if this continues.  Discussed which activities to do and which ones to avoid.  Increase activity slowly.  Follow-up again in 6 to 8 weeks     Update 01/31/2023 Kirk [name] is a 67 y.o. male coming in with complaint of L foot pain. 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[facility])    Past Surgical History:  Procedure Laterality Date   DENTAL SURGERY     TONSILLECTOMY     Social History   Socioeconomic History   Marital status: Married    Spouse name: Not on file   Number of children: Not on file   Years of education: Not on file   Highest education level: Not on file  Occupational History   Not on file  Tobacco Use   Smoking status: Never   Smokeless tobacco: Never  Substance and Sexual Activity   Alcohol use: Yes   Drug use: No   Sexual activity: Not on file  Other Topics Concern   Not on file  Social History Narrative   Not on file   Social Determinants of Health   Financial Resource Strain: Not on file  Food Insecurity: Not on file  Transportation Needs: Not on file  Physical Activity: Not on file  Stress: Not on file  Social Connections: Not on file   No Known Allergies Family History  Problem Relation Age of Onset   Congestive Heart Failure Father    Heart disease Father    Parkinson's disease Mother    Stroke Mother     Current Outpatient Medications (Endocrine & Metabolic):    levothyroxine (SYNTHROID) 88 MCG tablet, TAKE 1 TABLET BY MOUTH ONCE DAILY AND 1 EXTRA TABLET ON  SUNDAYS   levothyroxine (SYNTHROID) 88 MCG tablet, TAKE 1 TABLET BY MOUTH ONCE DAILY AND TAKE 1 EXTRA TABLET ON SUNDAYS   levothyroxine (SYNTHROID) 88 MCG tablet, Take 1 tablet (88 mcg total) by mouth daily AND take 2 tablets (176 mcg total) on Sunday.   levothyroxine (SYNTHROID, LEVOTHROID) 88 MCG tablet, Take 88 mcg by mouth daily before breakfast.   testosterone (ANDROGEL) 50 MG/5GM (1%) GEL, Apply 5 grams onto the skin daily.   testosterone (ANDROGEL) 50 MG/5GM (1%) GEL, APPLY 1 PACKET TO SKIN ONCE DAILY AS DIRECTED   testosterone (ANDROGEL) 50 MG/5GM (1%) GEL, APPLY 1 PACKET TO SKIN ONCE DAILY AS DIRECTED   testosterone (ANDROGEL) 50 MG/5GM (1%) GEL, APPLY 1 PACKET TO SKIN ONCE DAILY AS DIRECTED   testosterone (ANDROGEL) 50 MG/5GM (1%) GEL, APPLY 1 PACKET TO SKIN ONCE DAILY AS DIRECTED   testosterone (ANDROGEL) 50 MG/5GM (1%) GEL, APPLY 1 PACKET TO SKIN ONCE DAILY AS DIRECTED   testosterone (ANDROGEL) 50 MG/5GM (1%) GEL, Place one packet (5 g) onto the skin daily as directed  Current Outpatient Medications (Cardiovascular):    amLODipine (NORVASC) 5 MG tablet, Take 1 tablet (5 mg total) by mouth daily.   ezetimibe (ZETIA) 10 MG tablet, Take 10 mg by mouth daily.   ezetimibe (  ZETIA) 10 MG tablet, TAKE 1 TABLET BY MOUTH ONCE DAILY   ezetimibe (ZETIA) 10 MG tablet, TAKE 1 TABLET BY MOUTH ONCE DAILY   ezetimibe (ZETIA) 10 MG tablet, Take 1 tablet (10 mg total) by mouth daily.   losartan-hydrochlorothiazide (HYZAAR) 100-25 MG tablet, Take 0.5 tablets by mouth daily. Please make overdue appt with Dr. Katrinka Blazing before anymore refills. 2nd attempt   losartan-hydrochlorothiazide (HYZAAR) 50-12.5 MG tablet, TAKE 1 TABLET BY MOUTH DAILY   losartan-hydrochlorothiazide (HYZAAR) 50-12.5 MG tablet, TAKE 1 TABLET BY MOUTH DAILY   losartan-hydrochlorothiazide (HYZAAR) 50-12.5 MG tablet, Take 1 tablet by mouth daily.   losartan-hydrochlorothiazide (HYZAAR) 50-12.5 MG tablet, Take 1 tablet by mouth daily.    simvastatin (ZOCOR) 40 MG tablet, Take 40 mg by mouth daily.   simvastatin (ZOCOR) 40 MG tablet, Take 1 tablet (40 mg total) by mouth daily.   simvastatin (ZOCOR) 40 MG tablet, Take 1 tablet (40 mg total) by mouth daily.   Current Outpatient Medications (Analgesics):    aspirin EC 81 MG tablet, Take 81 mg by mouth daily.   Naproxen Sodium 220 MG CAPS, Take 220 mg by mouth 2 (two) times daily as needed (pain).   Current Outpatient Medications (Other):    Besifloxacin HCl (BESIVANCE) 0.6 % SUSP, Instill 1 drop into affected eye (Left eye) three times a day as directed.   Diclofenac Sodium (PENNSAID) 2 % SOLN, Place 2 g onto the skin 2 (two) times daily.   gabapentin (NEURONTIN) 100 MG capsule, TAKE 2 CAPSULES BY MOUTH AT BEDTIME   ketorolac (ACULAR) 0.5 % ophthalmic solution, Instill 1 drop into affected eye ( Left eye) twice a day as directed.   Loteprednol Etabonate (LOTEMAX SM) 0.38 % GEL, Instill 1 drop into both eyes three times a day   Melatonin 5 MG TABS, Take 5 mg by mouth at bedtime.   Multiple Vitamins-Minerals (CENTRUM SILVER PO), Take half (1/2) tablet by mouth every other day.   prednisoLONE acetate (PRED FORTE) 1 % ophthalmic suspension, Apply 1 drop into left eye three times a day as directed   prednisoLONE acetate (PRED FORTE) 1 % ophthalmic suspension, Apply 1 drop into affected eye (Left eye) three times a day as directed.   traZODone (DESYREL) 50 MG tablet, Take 50 mg by mouth at bedtime.   traZODone (DESYREL) 50 MG tablet, Take 1 tablet (50 mg total) by mouth at bedtime as needed.   traZODone (DESYREL) 50 MG tablet, Take 1 tablet (50 mg total) by mouth at bedtime as needed.   Reviewed prior external information including notes and imaging from  primary care provider As well as notes that were available from care everywhere and other healthcare systems.  Past medical history, social, surgical and family history all reviewed in electronic medical record.  No pertanent  information unless stated regarding to the chief complaint.   Review of Systems:  No headache, visual changes, nausea, vomiting, diarrhea, constipation, dizziness, abdominal pain, skin rash, fevers, chills, night sweats, weight loss, swollen lymph nodes, body aches, joint swelling, chest pain, shortness of breath, mood changes. POSITIVE muscle aches  Objective  There were no vitals taken for this visit.   General: No apparent distress alert and oriented x3 mood and affect normal, dressed appropriately.  HEENT: Pupils equal, extraocular movements intact  Respiratory: Patient's speak in full sentences and does not appear short of breath  Cardiovascular: No lower extremity edema, non tender, no erythema      Impression and Recommendations:

## 2023-01-31 ENCOUNTER — Ambulatory Visit: Payer: Commercial Managed Care - PPO | Admitting: Family Medicine

## 2023-02-05 DIAGNOSIS — H524 Presbyopia: Secondary | ICD-10-CM | POA: Diagnosis not present

## 2023-02-13 ENCOUNTER — Ambulatory Visit: Payer: Commercial Managed Care - PPO | Admitting: Family Medicine

## 2023-02-13 ENCOUNTER — Encounter: Payer: Self-pay | Admitting: Family Medicine

## 2023-02-13 ENCOUNTER — Other Ambulatory Visit: Payer: Self-pay

## 2023-02-13 VITALS — BP 132/90 | Ht 69.0 in | Wt 183.0 lb

## 2023-02-13 DIAGNOSIS — M19072 Primary osteoarthritis, left ankle and foot: Secondary | ICD-10-CM | POA: Diagnosis not present

## 2023-02-13 DIAGNOSIS — M79672 Pain in left foot: Secondary | ICD-10-CM

## 2023-02-13 DIAGNOSIS — G5782 Other specified mononeuropathies of left lower limb: Secondary | ICD-10-CM

## 2023-02-13 NOTE — Patient Instructions (Signed)
Spenco total orthotics Voltaren gel Ice before bed Looks really good See you again in 3 months

## 2023-02-13 NOTE — Assessment & Plan Note (Signed)
At this moment seems to be nearly completely resolved.  Negative compression.  Continue to get shoes, discussed some more transverse arch support.  Follow-up again in8 weeks

## 2023-02-13 NOTE — Progress Notes (Signed)
[name] [address] [place] [address] Phone: [phone] Subjective:   INadine [name], am serving as a scribe for Dr. Antoine Primas.  I'm seeing this patient by the request  of:  Adrian Prince, MD  CC: Foot pain follow  [id number]  11/25/2022 Chronic problem with worsening symptoms. Getting a repeat injection again today. Tolerated the procedure well. Discussed with patient about discontinuing and need to consider the possibility of PRP if this continues. Discussed which activities to do and which ones to avoid. Increase activity slowly. Follow-up again in 6 to 8 weeks   Updated 02/13/2023 Kirk [name] is a 67 y.o. male coming in with complaint of foot pain. Foot has been doing well. New shoes helping. Only a little bit of pinching when walking on hard surfaces too long. N new concerns.       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[facility])    Past Surgical History:  Procedure Laterality Date   DENTAL SURGERY     TONSILLECTOMY     Social History   Socioeconomic History   Marital status: Married    Spouse name: Not on file   Number of children: Not on file   Years of education: Not on file   Highest education level: Not on file  Occupational History   Not on file  Tobacco Use   Smoking status: Never   Smokeless tobacco: Never  Substance and Sexual Activity   Alcohol use: Yes   Drug use: No   Sexual activity: Not on file  Other Topics Concern   Not on file  Social History Narrative   Not on file   Social Determinants of Health   Financial Resource Strain: Not on file  Food Insecurity: Not on file  Transportation Needs: Not on file  Physical Activity: Not on file  Stress: Not on file  Social Connections: Not on file   No Known Allergies Family History  Problem Relation Age of Onset   Congestive Heart Failure Father    Heart disease Father    Parkinson's disease Mother     Stroke Mother     Current Outpatient Medications (Endocrine & Metabolic):    levothyroxine (SYNTHROID) 88 MCG tablet, TAKE 1 TABLET BY MOUTH ONCE DAILY AND 1 EXTRA TABLET ON SUNDAYS   levothyroxine (SYNTHROID) 88 MCG tablet, TAKE 1 TABLET BY MOUTH ONCE DAILY AND TAKE 1 EXTRA TABLET ON SUNDAYS   levothyroxine (SYNTHROID) 88 MCG tablet, Take 1 tablet (88 mcg total) by mouth daily AND take 2 tablets (176 mcg total) on Sunday.   levothyroxine (SYNTHROID, LEVOTHROID) 88 MCG tablet, Take 88 mcg by mouth daily before breakfast.   testosterone (ANDROGEL) 50 MG/5GM (1%) GEL, Apply 5 grams onto the skin daily.   testosterone (ANDROGEL) 50 MG/5GM (1%) GEL, APPLY 1 PACKET TO SKIN ONCE DAILY AS DIRECTED   testosterone (ANDROGEL) 50 MG/5GM (1%) GEL, APPLY 1 PACKET TO SKIN ONCE DAILY AS DIRECTED   testosterone (ANDROGEL) 50 MG/5GM (1%) GEL, APPLY 1 PACKET TO SKIN ONCE DAILY AS DIRECTED   testosterone (ANDROGEL) 50 MG/5GM (1%) GEL, APPLY 1 PACKET TO SKIN ONCE DAILY AS DIRECTED   testosterone (ANDROGEL) 50 MG/5GM (1%) GEL, APPLY 1 PACKET TO SKIN ONCE DAILY AS DIRECTED   testosterone (ANDROGEL) 50 MG/5GM (1%) GEL, Place one packet (5 g) onto the skin daily as directed  Current Outpatient Medications (Cardiovascular):    amLODipine (NORVASC) 5 MG  tablet, Take 1 tablet (5 mg total) by mouth daily.   ezetimibe (ZETIA) 10 MG tablet, Take 10 mg by mouth daily.   ezetimibe (ZETIA) 10 MG tablet, TAKE 1 TABLET BY MOUTH ONCE DAILY   ezetimibe (ZETIA) 10 MG tablet, TAKE 1 TABLET BY MOUTH ONCE DAILY   ezetimibe (ZETIA) 10 MG tablet, Take 1 tablet (10 mg total) by mouth daily.   losartan-hydrochlorothiazide (HYZAAR) 100-25 MG tablet, Take 0.5 tablets by mouth daily. Please make overdue appt with Dr. Katrinka Blazing before anymore refills. 2nd attempt   losartan-hydrochlorothiazide (HYZAAR) 50-12.5 MG tablet, TAKE 1 TABLET BY MOUTH DAILY   losartan-hydrochlorothiazide (HYZAAR) 50-12.5 MG tablet, TAKE 1 TABLET BY MOUTH DAILY    losartan-hydrochlorothiazide (HYZAAR) 50-12.5 MG tablet, Take 1 tablet by mouth daily.   losartan-hydrochlorothiazide (HYZAAR) 50-12.5 MG tablet, Take 1 tablet by mouth daily.   simvastatin (ZOCOR) 40 MG tablet, Take 40 mg by mouth daily.   simvastatin (ZOCOR) 40 MG tablet, Take 1 tablet (40 mg total) by mouth daily.   simvastatin (ZOCOR) 40 MG tablet, Take 1 tablet (40 mg total) by mouth daily.   Current Outpatient Medications (Analgesics):    aspirin EC 81 MG tablet, Take 81 mg by mouth daily.   Naproxen Sodium 220 MG CAPS, Take 220 mg by mouth 2 (two) times daily as needed (pain).   Current Outpatient Medications (Other):    Besifloxacin HCl (BESIVANCE) 0.6 % SUSP, Instill 1 drop into affected eye (Left eye) three times a day as directed.   Diclofenac Sodium (PENNSAID) 2 % SOLN, Place 2 g onto the skin 2 (two) times daily.   gabapentin (NEURONTIN) 100 MG capsule, TAKE 2 CAPSULES BY MOUTH AT BEDTIME   ketorolac (ACULAR) 0.5 % ophthalmic solution, Instill 1 drop into affected eye ( Left eye) twice a day as directed.   Loteprednol Etabonate (LOTEMAX SM) 0.38 % GEL, Instill 1 drop into both eyes three times a day   Melatonin 5 MG TABS, Take 5 mg by mouth at bedtime.   Multiple Vitamins-Minerals (CENTRUM SILVER PO), Take half (1/2) tablet by mouth every other day.   prednisoLONE acetate (PRED FORTE) 1 % ophthalmic suspension, Apply 1 drop into left eye three times a day as directed   prednisoLONE acetate (PRED FORTE) 1 % ophthalmic suspension, Apply 1 drop into affected eye (Left eye) three times a day as directed.   traZODone (DESYREL) 50 MG tablet, Take 50 mg by mouth at bedtime.   traZODone (DESYREL) 50 MG tablet, Take 1 tablet (50 mg total) by mouth at bedtime as needed.   traZODone (DESYREL) 50 MG tablet, Take 1 tablet (50 mg total) by mouth at bedtime as needed.   Reviewed prior external information including notes and imaging from  primary care provider As well as notes that were  available from care everywhere and other healthcare systems.  Past medical history, social, surgical and family history all reviewed in electronic medical record.  No pertanent information unless stated regarding to the chief complaint.   Review of Systems:  No headache, visual changes, nausea, vomiting, diarrhea, constipation, dizziness, abdominal pain, skin rash, fevers, chills, night sweats, weight loss, swollen lymph nodes, body aches, joint swelling, chest pain, shortness of breath, mood changes. POSITIVE muscle aches  Objective  Blood pressure (!) 132/90, height 5\' 9"  (1.753 m), weight 183 lb (83 kg).   General: No apparent distress alert and oriented x3 mood and affect normal, dressed appropriately.  HEENT: Pupils equal, extraocular movements intact  Respiratory: Patient's speak  in full sentences and does not appear short of breath  Cardiovascular: No lower extremity edema, non tender, no erythema  Foot pain does show some breakdown of the chest discharge noted. Patient's left foot does have some hallux noted.  Limited muscular skeletal ultrasound was performed and interpreted by Antoine Primas, M   Limited ultrasound shows some hypoechoic changes of the first MTP noted.  No significant findings of recurrent neuroma. 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

## 2023-02-13 NOTE — Assessment & Plan Note (Signed)
Arthritis noted with some hallux limitus noted.  On ultrasound does have some hypoechoic changes.  Discussed topical anti-inflammatories, icing regimen, worsening pain consider injection.  Follow-up again in 2 to 3 months

## 2023-02-14 DIAGNOSIS — R251 Tremor, unspecified: Secondary | ICD-10-CM | POA: Diagnosis not present

## 2023-02-14 DIAGNOSIS — E039 Hypothyroidism, unspecified: Secondary | ICD-10-CM | POA: Diagnosis not present

## 2023-02-14 DIAGNOSIS — G4700 Insomnia, unspecified: Secondary | ICD-10-CM | POA: Diagnosis not present

## 2023-02-14 DIAGNOSIS — E236 Other disorders of pituitary gland: Secondary | ICD-10-CM | POA: Diagnosis not present

## 2023-02-14 DIAGNOSIS — E785 Hyperlipidemia, unspecified: Secondary | ICD-10-CM | POA: Diagnosis not present

## 2023-02-14 DIAGNOSIS — E559 Vitamin D deficiency, unspecified: Secondary | ICD-10-CM | POA: Diagnosis not present

## 2023-02-14 DIAGNOSIS — I7300 Raynaud's syndrome without gangrene: Secondary | ICD-10-CM | POA: Diagnosis not present

## 2023-02-14 DIAGNOSIS — R7301 Impaired fasting glucose: Secondary | ICD-10-CM | POA: Diagnosis not present

## 2023-02-14 DIAGNOSIS — E291 Testicular hypofunction: Secondary | ICD-10-CM | POA: Diagnosis not present

## 2023-02-14 DIAGNOSIS — D751 Secondary polycythemia: Secondary | ICD-10-CM | POA: Diagnosis not present

## 2023-02-18 ENCOUNTER — Ambulatory Visit: Payer: Commercial Managed Care - PPO | Admitting: Family Medicine

## 2023-02-26 ENCOUNTER — Other Ambulatory Visit (HOSPITAL_BASED_OUTPATIENT_CLINIC_OR_DEPARTMENT_OTHER): Payer: Self-pay

## 2023-02-26 MED ORDER — NEOMYCIN-POLYMYXIN-DEXAMETH 3.5-10000-0.1 OP OINT
TOPICAL_OINTMENT | OPHTHALMIC | 0 refills | Status: AC
  Filled 2023-02-26: qty 3.5, 3d supply, fill #0

## 2023-03-04 DIAGNOSIS — H57813 Brow ptosis, bilateral: Secondary | ICD-10-CM | POA: Diagnosis not present

## 2023-03-04 DIAGNOSIS — H02423 Myogenic ptosis of bilateral eyelids: Secondary | ICD-10-CM | POA: Diagnosis not present

## 2023-03-04 DIAGNOSIS — H02412 Mechanical ptosis of left eyelid: Secondary | ICD-10-CM | POA: Diagnosis not present

## 2023-03-04 DIAGNOSIS — H02411 Mechanical ptosis of right eyelid: Secondary | ICD-10-CM | POA: Diagnosis not present

## 2023-03-04 DIAGNOSIS — H02834 Dermatochalasis of left upper eyelid: Secondary | ICD-10-CM | POA: Diagnosis not present

## 2023-03-04 DIAGNOSIS — H02421 Myogenic ptosis of right eyelid: Secondary | ICD-10-CM | POA: Diagnosis not present

## 2023-03-04 DIAGNOSIS — D485 Neoplasm of uncertain behavior of skin: Secondary | ICD-10-CM | POA: Diagnosis not present

## 2023-03-04 DIAGNOSIS — H02422 Myogenic ptosis of left eyelid: Secondary | ICD-10-CM | POA: Diagnosis not present

## 2023-03-04 DIAGNOSIS — H02831 Dermatochalasis of right upper eyelid: Secondary | ICD-10-CM | POA: Diagnosis not present

## 2023-03-04 DIAGNOSIS — H02413 Mechanical ptosis of bilateral eyelids: Secondary | ICD-10-CM | POA: Diagnosis not present

## 2023-03-05 ENCOUNTER — Other Ambulatory Visit (HOSPITAL_COMMUNITY): Payer: Self-pay

## 2023-03-06 ENCOUNTER — Other Ambulatory Visit: Payer: Self-pay

## 2023-03-06 ENCOUNTER — Other Ambulatory Visit (HOSPITAL_BASED_OUTPATIENT_CLINIC_OR_DEPARTMENT_OTHER): Payer: Self-pay

## 2023-03-06 MED ORDER — ERYTHROMYCIN 5 MG/GM OP OINT
TOPICAL_OINTMENT | OPHTHALMIC | 0 refills | Status: AC
  Filled 2023-03-06: qty 3.5, 5d supply, fill #0

## 2023-03-06 MED ORDER — LOTEMAX 0.5 % OP OINT
TOPICAL_OINTMENT | OPHTHALMIC | 0 refills | Status: AC
  Filled 2023-03-06: qty 3.5, 7d supply, fill #0

## 2023-03-07 ENCOUNTER — Other Ambulatory Visit (HOSPITAL_BASED_OUTPATIENT_CLINIC_OR_DEPARTMENT_OTHER): Payer: Self-pay

## 2023-03-17 ENCOUNTER — Other Ambulatory Visit (HOSPITAL_COMMUNITY): Payer: Self-pay

## 2023-04-11 ENCOUNTER — Other Ambulatory Visit (HOSPITAL_COMMUNITY): Payer: Self-pay

## 2023-04-14 ENCOUNTER — Encounter (HOSPITAL_COMMUNITY): Payer: Self-pay

## 2023-04-14 ENCOUNTER — Other Ambulatory Visit (HOSPITAL_COMMUNITY): Payer: Self-pay

## 2023-04-14 ENCOUNTER — Other Ambulatory Visit: Payer: Self-pay

## 2023-04-14 MED ORDER — TESTOSTERONE 50 MG/5GM (1%) TD GEL
5.0000 g | Freq: Every day | TRANSDERMAL | 3 refills | Status: DC
  Filled 2023-04-14 – 2023-04-18 (×3): qty 150, 30d supply, fill #0
  Filled 2023-05-16: qty 150, 30d supply, fill #1
  Filled 2023-06-25: qty 150, 30d supply, fill #2
  Filled 2023-07-18 – 2023-07-23 (×3): qty 150, 30d supply, fill #3
  Filled 2023-07-30: qty 150, 30d supply, fill #0

## 2023-04-15 ENCOUNTER — Other Ambulatory Visit: Payer: Self-pay

## 2023-04-15 ENCOUNTER — Other Ambulatory Visit (HOSPITAL_COMMUNITY): Payer: Self-pay

## 2023-04-18 ENCOUNTER — Other Ambulatory Visit: Payer: Self-pay

## 2023-04-18 ENCOUNTER — Other Ambulatory Visit (HOSPITAL_COMMUNITY): Payer: Self-pay

## 2023-05-01 ENCOUNTER — Other Ambulatory Visit (HOSPITAL_COMMUNITY): Payer: Self-pay

## 2023-05-12 ENCOUNTER — Ambulatory Visit: Payer: Commercial Managed Care - PPO | Admitting: Family Medicine

## 2023-05-14 ENCOUNTER — Other Ambulatory Visit (HOSPITAL_COMMUNITY): Payer: Self-pay

## 2023-05-15 ENCOUNTER — Ambulatory Visit: Payer: Commercial Managed Care - PPO | Admitting: Family Medicine

## 2023-05-16 ENCOUNTER — Other Ambulatory Visit: Payer: Self-pay

## 2023-06-05 DIAGNOSIS — H04123 Dry eye syndrome of bilateral lacrimal glands: Secondary | ICD-10-CM | POA: Diagnosis not present

## 2023-06-25 ENCOUNTER — Other Ambulatory Visit (HOSPITAL_COMMUNITY): Payer: Self-pay

## 2023-06-27 ENCOUNTER — Other Ambulatory Visit (HOSPITAL_COMMUNITY): Payer: Self-pay

## 2023-06-27 ENCOUNTER — Other Ambulatory Visit: Payer: Self-pay

## 2023-06-27 MED ORDER — LOSARTAN POTASSIUM-HCTZ 50-12.5 MG PO TABS
1.0000 | ORAL_TABLET | Freq: Every day | ORAL | 3 refills | Status: DC
  Filled 2023-06-27: qty 90, 90d supply, fill #0
  Filled 2023-09-19: qty 90, 90d supply, fill #1
  Filled 2023-12-11 – 2023-12-18 (×2): qty 90, 90d supply, fill #2
  Filled 2024-03-10 – 2024-03-11 (×2): qty 90, 90d supply, fill #3

## 2023-06-27 MED ORDER — EZETIMIBE 10 MG PO TABS
10.0000 mg | ORAL_TABLET | Freq: Every day | ORAL | 4 refills | Status: AC
  Filled 2023-06-27: qty 90, 90d supply, fill #0
  Filled 2023-09-19: qty 90, 90d supply, fill #1
  Filled 2023-12-11 – 2023-12-18 (×2): qty 90, 90d supply, fill #2
  Filled 2024-03-10 – 2024-03-11 (×2): qty 90, 90d supply, fill #3
  Filled 2024-06-16: qty 90, 90d supply, fill #4

## 2023-06-27 MED ORDER — SIMVASTATIN 40 MG PO TABS
40.0000 mg | ORAL_TABLET | Freq: Every day | ORAL | 4 refills | Status: AC
  Filled 2023-06-27: qty 90, 90d supply, fill #0
  Filled 2023-09-19: qty 90, 90d supply, fill #1
  Filled 2023-12-11 – 2023-12-18 (×2): qty 90, 90d supply, fill #2
  Filled 2024-03-10 – 2024-03-11 (×2): qty 90, 90d supply, fill #3
  Filled 2024-06-16: qty 90, 90d supply, fill #4

## 2023-06-27 MED ORDER — TRAZODONE HCL 50 MG PO TABS
50.0000 mg | ORAL_TABLET | Freq: Every day | ORAL | 3 refills | Status: DC
  Filled 2023-06-27: qty 90, 90d supply, fill #0
  Filled 2023-09-19: qty 90, 90d supply, fill #1
  Filled 2023-12-11 – 2023-12-18 (×2): qty 90, 90d supply, fill #2
  Filled 2024-03-10 – 2024-03-11 (×2): qty 90, 90d supply, fill #3

## 2023-06-27 MED ORDER — LEVOTHYROXINE SODIUM 88 MCG PO TABS
88.0000 ug | ORAL_TABLET | Freq: Every day | ORAL | 3 refills | Status: DC
  Filled 2023-06-27: qty 102, 90d supply, fill #0
  Filled 2023-09-19: qty 102, 90d supply, fill #1
  Filled 2023-12-11 – 2023-12-18 (×2): qty 102, 90d supply, fill #2
  Filled 2024-03-10 – 2024-03-11 (×2): qty 102, 90d supply, fill #3

## 2023-07-18 ENCOUNTER — Other Ambulatory Visit (HOSPITAL_COMMUNITY): Payer: Self-pay

## 2023-07-21 ENCOUNTER — Other Ambulatory Visit (HOSPITAL_COMMUNITY): Payer: Self-pay

## 2023-07-21 ENCOUNTER — Other Ambulatory Visit: Payer: Self-pay

## 2023-07-24 ENCOUNTER — Other Ambulatory Visit: Payer: Self-pay

## 2023-07-26 ENCOUNTER — Other Ambulatory Visit (HOSPITAL_COMMUNITY): Payer: Self-pay

## 2023-07-26 ENCOUNTER — Other Ambulatory Visit (HOSPITAL_BASED_OUTPATIENT_CLINIC_OR_DEPARTMENT_OTHER): Payer: Self-pay

## 2023-07-28 ENCOUNTER — Other Ambulatory Visit (HOSPITAL_COMMUNITY): Payer: Self-pay

## 2023-07-29 ENCOUNTER — Other Ambulatory Visit (HOSPITAL_COMMUNITY): Payer: Self-pay

## 2023-07-29 DIAGNOSIS — E291 Testicular hypofunction: Secondary | ICD-10-CM | POA: Diagnosis not present

## 2023-07-29 DIAGNOSIS — E039 Hypothyroidism, unspecified: Secondary | ICD-10-CM | POA: Diagnosis not present

## 2023-07-29 DIAGNOSIS — Z125 Encounter for screening for malignant neoplasm of prostate: Secondary | ICD-10-CM | POA: Diagnosis not present

## 2023-07-29 DIAGNOSIS — Z1212 Encounter for screening for malignant neoplasm of rectum: Secondary | ICD-10-CM | POA: Diagnosis not present

## 2023-07-29 DIAGNOSIS — E559 Vitamin D deficiency, unspecified: Secondary | ICD-10-CM | POA: Diagnosis not present

## 2023-07-29 DIAGNOSIS — R7301 Impaired fasting glucose: Secondary | ICD-10-CM | POA: Diagnosis not present

## 2023-07-29 DIAGNOSIS — E785 Hyperlipidemia, unspecified: Secondary | ICD-10-CM | POA: Diagnosis not present

## 2023-07-30 ENCOUNTER — Other Ambulatory Visit (HOSPITAL_BASED_OUTPATIENT_CLINIC_OR_DEPARTMENT_OTHER): Payer: Self-pay

## 2023-07-30 ENCOUNTER — Other Ambulatory Visit: Payer: Self-pay

## 2023-07-30 ENCOUNTER — Other Ambulatory Visit (HOSPITAL_COMMUNITY): Payer: Self-pay

## 2023-07-31 ENCOUNTER — Other Ambulatory Visit (HOSPITAL_BASED_OUTPATIENT_CLINIC_OR_DEPARTMENT_OTHER): Payer: Self-pay

## 2023-07-31 ENCOUNTER — Other Ambulatory Visit: Payer: Self-pay

## 2023-08-04 ENCOUNTER — Other Ambulatory Visit (HOSPITAL_COMMUNITY): Payer: Self-pay

## 2023-08-04 DIAGNOSIS — R7301 Impaired fasting glucose: Secondary | ICD-10-CM | POA: Diagnosis not present

## 2023-08-04 DIAGNOSIS — I7300 Raynaud's syndrome without gangrene: Secondary | ICD-10-CM | POA: Diagnosis not present

## 2023-08-04 DIAGNOSIS — E785 Hyperlipidemia, unspecified: Secondary | ICD-10-CM | POA: Diagnosis not present

## 2023-08-04 DIAGNOSIS — D751 Secondary polycythemia: Secondary | ICD-10-CM | POA: Diagnosis not present

## 2023-08-04 DIAGNOSIS — E559 Vitamin D deficiency, unspecified: Secondary | ICD-10-CM | POA: Diagnosis not present

## 2023-08-04 DIAGNOSIS — E039 Hypothyroidism, unspecified: Secondary | ICD-10-CM | POA: Diagnosis not present

## 2023-08-04 DIAGNOSIS — Z0000 Encounter for general adult medical examination without abnormal findings: Secondary | ICD-10-CM | POA: Diagnosis not present

## 2023-08-04 DIAGNOSIS — F5232 Male orgasmic disorder: Secondary | ICD-10-CM | POA: Diagnosis not present

## 2023-08-04 DIAGNOSIS — E291 Testicular hypofunction: Secondary | ICD-10-CM | POA: Diagnosis not present

## 2023-08-04 DIAGNOSIS — E236 Other disorders of pituitary gland: Secondary | ICD-10-CM | POA: Diagnosis not present

## 2023-08-08 DIAGNOSIS — E291 Testicular hypofunction: Secondary | ICD-10-CM | POA: Diagnosis not present

## 2023-08-08 DIAGNOSIS — R82998 Other abnormal findings in urine: Secondary | ICD-10-CM | POA: Diagnosis not present

## 2023-08-08 DIAGNOSIS — Z1212 Encounter for screening for malignant neoplasm of rectum: Secondary | ICD-10-CM | POA: Diagnosis not present

## 2023-08-22 ENCOUNTER — Other Ambulatory Visit (HOSPITAL_BASED_OUTPATIENT_CLINIC_OR_DEPARTMENT_OTHER): Payer: Self-pay

## 2023-08-22 ENCOUNTER — Other Ambulatory Visit (HOSPITAL_COMMUNITY): Payer: Self-pay

## 2023-08-25 ENCOUNTER — Other Ambulatory Visit (HOSPITAL_BASED_OUTPATIENT_CLINIC_OR_DEPARTMENT_OTHER): Payer: Self-pay

## 2023-08-28 ENCOUNTER — Other Ambulatory Visit (HOSPITAL_BASED_OUTPATIENT_CLINIC_OR_DEPARTMENT_OTHER): Payer: Self-pay

## 2023-08-28 MED ORDER — TESTOSTERONE 50 MG/5GM (1%) TD GEL
5.0000 g | Freq: Every day | TRANSDERMAL | 4 refills | Status: DC
  Filled 2023-08-28: qty 150, 30d supply, fill #0
  Filled 2023-09-19 – 2023-09-26 (×2): qty 150, 30d supply, fill #1
  Filled 2023-10-04 – 2023-10-24 (×2): qty 150, 30d supply, fill #2
  Filled 2023-12-18: qty 150, 30d supply, fill #3
  Filled 2024-01-19 (×2): qty 150, 30d supply, fill #4

## 2023-08-29 ENCOUNTER — Other Ambulatory Visit (HOSPITAL_BASED_OUTPATIENT_CLINIC_OR_DEPARTMENT_OTHER): Payer: Self-pay

## 2023-09-19 ENCOUNTER — Other Ambulatory Visit: Payer: Self-pay

## 2023-09-19 ENCOUNTER — Other Ambulatory Visit (HOSPITAL_BASED_OUTPATIENT_CLINIC_OR_DEPARTMENT_OTHER): Payer: Self-pay

## 2023-09-19 ENCOUNTER — Other Ambulatory Visit (HOSPITAL_COMMUNITY): Payer: Self-pay

## 2023-09-22 ENCOUNTER — Other Ambulatory Visit (HOSPITAL_BASED_OUTPATIENT_CLINIC_OR_DEPARTMENT_OTHER): Payer: Self-pay

## 2023-09-25 ENCOUNTER — Other Ambulatory Visit (HOSPITAL_COMMUNITY): Payer: Self-pay

## 2023-09-25 ENCOUNTER — Other Ambulatory Visit (HOSPITAL_BASED_OUTPATIENT_CLINIC_OR_DEPARTMENT_OTHER): Payer: Self-pay

## 2023-09-25 DIAGNOSIS — R3912 Poor urinary stream: Secondary | ICD-10-CM | POA: Diagnosis not present

## 2023-09-25 DIAGNOSIS — R3915 Urgency of urination: Secondary | ICD-10-CM | POA: Diagnosis not present

## 2023-09-25 DIAGNOSIS — N401 Enlarged prostate with lower urinary tract symptoms: Secondary | ICD-10-CM | POA: Diagnosis not present

## 2023-09-25 DIAGNOSIS — R3911 Hesitancy of micturition: Secondary | ICD-10-CM | POA: Diagnosis not present

## 2023-09-25 DIAGNOSIS — E291 Testicular hypofunction: Secondary | ICD-10-CM | POA: Diagnosis not present

## 2023-09-25 DIAGNOSIS — N5311 Retarded ejaculation: Secondary | ICD-10-CM | POA: Diagnosis not present

## 2023-09-25 MED ORDER — TADALAFIL 5 MG PO TABS
5.0000 mg | ORAL_TABLET | Freq: Every day | ORAL | 6 refills | Status: AC
  Filled 2023-09-25: qty 30, 30d supply, fill #0
  Filled 2023-09-25: qty 10, 10d supply, fill #0
  Filled 2023-09-25: qty 20, 20d supply, fill #0
  Filled 2024-03-29 (×2): qty 30, 30d supply, fill #1
  Filled 2024-04-21: qty 30, 30d supply, fill #2

## 2023-09-26 ENCOUNTER — Other Ambulatory Visit (HOSPITAL_BASED_OUTPATIENT_CLINIC_OR_DEPARTMENT_OTHER): Payer: Self-pay

## 2023-09-29 ENCOUNTER — Other Ambulatory Visit (HOSPITAL_BASED_OUTPATIENT_CLINIC_OR_DEPARTMENT_OTHER): Payer: Self-pay

## 2023-09-29 ENCOUNTER — Other Ambulatory Visit: Payer: Self-pay

## 2023-09-29 MED ORDER — TADALAFIL 5 MG PO TABS
5.0000 mg | ORAL_TABLET | Freq: Every day | ORAL | 3 refills | Status: AC
  Filled 2023-09-29: qty 90, 90d supply, fill #0
  Filled 2023-12-22 (×2): qty 90, 90d supply, fill #1
  Filled 2024-05-18: qty 90, 90d supply, fill #2
  Filled 2024-08-09: qty 90, 90d supply, fill #3

## 2023-10-04 ENCOUNTER — Other Ambulatory Visit (HOSPITAL_BASED_OUTPATIENT_CLINIC_OR_DEPARTMENT_OTHER): Payer: Self-pay

## 2023-10-21 ENCOUNTER — Other Ambulatory Visit (HOSPITAL_COMMUNITY): Payer: Self-pay

## 2023-10-21 ENCOUNTER — Other Ambulatory Visit (HOSPITAL_BASED_OUTPATIENT_CLINIC_OR_DEPARTMENT_OTHER): Payer: Self-pay

## 2023-10-24 ENCOUNTER — Other Ambulatory Visit: Payer: Self-pay

## 2023-10-24 ENCOUNTER — Other Ambulatory Visit (HOSPITAL_BASED_OUTPATIENT_CLINIC_OR_DEPARTMENT_OTHER): Payer: Self-pay

## 2023-11-10 ENCOUNTER — Other Ambulatory Visit (HOSPITAL_COMMUNITY): Payer: Self-pay

## 2023-12-11 ENCOUNTER — Other Ambulatory Visit (HOSPITAL_COMMUNITY): Payer: Self-pay

## 2023-12-18 ENCOUNTER — Other Ambulatory Visit (HOSPITAL_COMMUNITY): Payer: Self-pay

## 2023-12-18 ENCOUNTER — Other Ambulatory Visit: Payer: Self-pay

## 2023-12-22 ENCOUNTER — Other Ambulatory Visit (HOSPITAL_COMMUNITY): Payer: Self-pay

## 2023-12-22 ENCOUNTER — Other Ambulatory Visit (HOSPITAL_BASED_OUTPATIENT_CLINIC_OR_DEPARTMENT_OTHER): Payer: Self-pay

## 2024-01-01 DIAGNOSIS — H04123 Dry eye syndrome of bilateral lacrimal glands: Secondary | ICD-10-CM | POA: Diagnosis not present

## 2024-01-01 DIAGNOSIS — H16103 Unspecified superficial keratitis, bilateral: Secondary | ICD-10-CM | POA: Diagnosis not present

## 2024-01-01 DIAGNOSIS — H02889 Meibomian gland dysfunction of unspecified eye, unspecified eyelid: Secondary | ICD-10-CM | POA: Diagnosis not present

## 2024-01-19 ENCOUNTER — Other Ambulatory Visit (HOSPITAL_BASED_OUTPATIENT_CLINIC_OR_DEPARTMENT_OTHER): Payer: Self-pay

## 2024-01-19 ENCOUNTER — Other Ambulatory Visit (HOSPITAL_COMMUNITY): Payer: Self-pay

## 2024-03-04 DIAGNOSIS — I5189 Other ill-defined heart diseases: Secondary | ICD-10-CM | POA: Diagnosis not present

## 2024-03-04 DIAGNOSIS — I452 Bifascicular block: Secondary | ICD-10-CM | POA: Diagnosis not present

## 2024-03-04 DIAGNOSIS — E039 Hypothyroidism, unspecified: Secondary | ICD-10-CM | POA: Diagnosis not present

## 2024-03-04 DIAGNOSIS — R7301 Impaired fasting glucose: Secondary | ICD-10-CM | POA: Diagnosis not present

## 2024-03-04 DIAGNOSIS — E559 Vitamin D deficiency, unspecified: Secondary | ICD-10-CM | POA: Diagnosis not present

## 2024-03-04 DIAGNOSIS — D751 Secondary polycythemia: Secondary | ICD-10-CM | POA: Diagnosis not present

## 2024-03-04 DIAGNOSIS — E291 Testicular hypofunction: Secondary | ICD-10-CM | POA: Diagnosis not present

## 2024-03-04 DIAGNOSIS — E236 Other disorders of pituitary gland: Secondary | ICD-10-CM | POA: Diagnosis not present

## 2024-03-04 DIAGNOSIS — E785 Hyperlipidemia, unspecified: Secondary | ICD-10-CM | POA: Diagnosis not present

## 2024-03-04 DIAGNOSIS — G4700 Insomnia, unspecified: Secondary | ICD-10-CM | POA: Diagnosis not present

## 2024-03-04 DIAGNOSIS — F5221 Male erectile disorder: Secondary | ICD-10-CM | POA: Diagnosis not present

## 2024-03-10 ENCOUNTER — Other Ambulatory Visit (HOSPITAL_COMMUNITY): Payer: Self-pay

## 2024-03-11 ENCOUNTER — Other Ambulatory Visit (HOSPITAL_BASED_OUTPATIENT_CLINIC_OR_DEPARTMENT_OTHER): Payer: Self-pay

## 2024-03-11 ENCOUNTER — Other Ambulatory Visit (HOSPITAL_COMMUNITY): Payer: Self-pay

## 2024-03-12 ENCOUNTER — Other Ambulatory Visit (HOSPITAL_COMMUNITY): Payer: Self-pay

## 2024-03-12 ENCOUNTER — Other Ambulatory Visit: Payer: Self-pay

## 2024-03-12 MED ORDER — TESTOSTERONE 50 MG/5GM (1%) TD GEL
5.0000 g | Freq: Every day | TRANSDERMAL | 4 refills | Status: AC
  Filled 2024-03-12 (×2): qty 150, 30d supply, fill #0
  Filled 2024-03-29 – 2024-05-22 (×4): qty 150, 30d supply, fill #1
  Filled 2024-08-24: qty 150, 30d supply, fill #0
  Filled 2024-08-24: qty 150, 30d supply, fill #1

## 2024-03-29 ENCOUNTER — Other Ambulatory Visit: Payer: Self-pay

## 2024-03-29 ENCOUNTER — Other Ambulatory Visit (HOSPITAL_COMMUNITY): Payer: Self-pay

## 2024-03-29 MED ORDER — TESTOSTERONE 50 MG/5GM (1%) TD GEL
5.0000 g | Freq: Every day | TRANSDERMAL | 4 refills | Status: AC
  Filled 2024-03-30 – 2024-04-28 (×2): qty 150, 30d supply, fill #0
  Filled 2024-05-31: qty 150, 30d supply, fill #1
  Filled 2024-06-28 – 2024-07-01 (×2): qty 150, 30d supply, fill #2
  Filled 2024-07-26: qty 150, 30d supply, fill #3

## 2024-03-30 ENCOUNTER — Other Ambulatory Visit: Payer: Self-pay

## 2024-03-30 ENCOUNTER — Other Ambulatory Visit (HOSPITAL_COMMUNITY): Payer: Self-pay

## 2024-03-31 ENCOUNTER — Other Ambulatory Visit (HOSPITAL_COMMUNITY): Payer: Self-pay

## 2024-03-31 ENCOUNTER — Other Ambulatory Visit: Payer: Self-pay

## 2024-04-21 ENCOUNTER — Other Ambulatory Visit (HOSPITAL_COMMUNITY): Payer: Self-pay

## 2024-04-22 ENCOUNTER — Other Ambulatory Visit (HOSPITAL_COMMUNITY): Payer: Self-pay

## 2024-04-23 ENCOUNTER — Other Ambulatory Visit (HOSPITAL_BASED_OUTPATIENT_CLINIC_OR_DEPARTMENT_OTHER): Payer: Self-pay

## 2024-04-28 ENCOUNTER — Other Ambulatory Visit (HOSPITAL_BASED_OUTPATIENT_CLINIC_OR_DEPARTMENT_OTHER): Payer: Self-pay

## 2024-04-28 ENCOUNTER — Other Ambulatory Visit: Payer: Self-pay

## 2024-05-18 ENCOUNTER — Other Ambulatory Visit (HOSPITAL_COMMUNITY): Payer: Self-pay

## 2024-05-21 ENCOUNTER — Other Ambulatory Visit (HOSPITAL_COMMUNITY): Payer: Self-pay

## 2024-05-22 ENCOUNTER — Other Ambulatory Visit (HOSPITAL_COMMUNITY): Payer: Self-pay

## 2024-05-31 ENCOUNTER — Other Ambulatory Visit: Payer: Self-pay

## 2024-05-31 ENCOUNTER — Other Ambulatory Visit (HOSPITAL_COMMUNITY): Payer: Self-pay

## 2024-06-01 ENCOUNTER — Other Ambulatory Visit: Payer: Self-pay

## 2024-06-09 ENCOUNTER — Other Ambulatory Visit (HOSPITAL_COMMUNITY): Payer: Self-pay

## 2024-06-09 ENCOUNTER — Other Ambulatory Visit: Payer: Self-pay

## 2024-06-09 MED ORDER — LOSARTAN POTASSIUM-HCTZ 50-12.5 MG PO TABS
1.0000 | ORAL_TABLET | Freq: Every day | ORAL | 3 refills | Status: AC
  Filled 2024-06-09: qty 90, 90d supply, fill #0

## 2024-06-09 MED ORDER — TRAZODONE HCL 50 MG PO TABS
50.0000 mg | ORAL_TABLET | Freq: Every evening | ORAL | 3 refills | Status: AC | PRN
  Filled 2024-06-09: qty 90, 90d supply, fill #0

## 2024-06-09 MED ORDER — LEVOTHYROXINE SODIUM 88 MCG PO TABS
ORAL_TABLET | ORAL | 3 refills | Status: AC
Start: 2024-06-09 — End: ?
  Filled 2024-06-09: qty 102, 90d supply, fill #0

## 2024-06-16 ENCOUNTER — Other Ambulatory Visit (HOSPITAL_COMMUNITY): Payer: Self-pay

## 2024-06-25 ENCOUNTER — Other Ambulatory Visit (HOSPITAL_COMMUNITY): Payer: Self-pay

## 2024-06-25 ENCOUNTER — Other Ambulatory Visit (HOSPITAL_BASED_OUTPATIENT_CLINIC_OR_DEPARTMENT_OTHER): Payer: Self-pay

## 2024-06-28 ENCOUNTER — Other Ambulatory Visit (HOSPITAL_BASED_OUTPATIENT_CLINIC_OR_DEPARTMENT_OTHER): Payer: Self-pay

## 2024-06-28 ENCOUNTER — Other Ambulatory Visit: Payer: Self-pay

## 2024-07-01 ENCOUNTER — Other Ambulatory Visit (HOSPITAL_BASED_OUTPATIENT_CLINIC_OR_DEPARTMENT_OTHER): Payer: Self-pay

## 2024-07-26 ENCOUNTER — Other Ambulatory Visit: Payer: Self-pay

## 2024-07-26 ENCOUNTER — Other Ambulatory Visit (HOSPITAL_BASED_OUTPATIENT_CLINIC_OR_DEPARTMENT_OTHER): Payer: Self-pay

## 2024-07-26 ENCOUNTER — Other Ambulatory Visit (HOSPITAL_COMMUNITY): Payer: Self-pay

## 2024-07-27 ENCOUNTER — Other Ambulatory Visit (HOSPITAL_BASED_OUTPATIENT_CLINIC_OR_DEPARTMENT_OTHER): Payer: Self-pay

## 2024-08-06 ENCOUNTER — Other Ambulatory Visit (HOSPITAL_COMMUNITY): Payer: Self-pay

## 2024-08-09 ENCOUNTER — Other Ambulatory Visit (HOSPITAL_COMMUNITY): Payer: Self-pay

## 2024-08-09 ENCOUNTER — Other Ambulatory Visit: Payer: Self-pay

## 2024-08-09 ENCOUNTER — Other Ambulatory Visit (HOSPITAL_BASED_OUTPATIENT_CLINIC_OR_DEPARTMENT_OTHER): Payer: Self-pay

## 2024-08-24 ENCOUNTER — Other Ambulatory Visit (HOSPITAL_BASED_OUTPATIENT_CLINIC_OR_DEPARTMENT_OTHER): Payer: Self-pay

## 2024-08-24 ENCOUNTER — Other Ambulatory Visit (HOSPITAL_COMMUNITY): Payer: Self-pay

## 2024-08-24 ENCOUNTER — Other Ambulatory Visit: Payer: Self-pay
